# Patient Record
Sex: Male | Born: 1985 | Race: Black or African American | Hispanic: No | Marital: Single | Smoking: Never smoker
Health system: Southern US, Community
[De-identification: ages and names within clinical notes are randomized; demographics above are authoritative.]

## PROBLEM LIST (undated history)

## (undated) ENCOUNTER — Ambulatory Visit (HOSPITAL_COMMUNITY): Payer: BC Managed Care – PPO

## (undated) DIAGNOSIS — R51 Headache: Secondary | ICD-10-CM

## (undated) DIAGNOSIS — R519 Headache, unspecified: Secondary | ICD-10-CM

## (undated) HISTORY — PX: PARTIAL COLECTOMY: SHX5273

## (undated) HISTORY — PX: ANKLE SURGERY: SHX546

---

## 2012-06-24 DIAGNOSIS — K661 Hemoperitoneum: Secondary | ICD-10-CM | POA: Insufficient documentation

## 2012-06-24 DIAGNOSIS — S92001A Unspecified fracture of right calcaneus, initial encounter for closed fracture: Secondary | ICD-10-CM | POA: Insufficient documentation

## 2012-06-24 DIAGNOSIS — D62 Acute posthemorrhagic anemia: Secondary | ICD-10-CM | POA: Insufficient documentation

## 2012-06-25 DIAGNOSIS — E872 Acidosis: Secondary | ICD-10-CM | POA: Insufficient documentation

## 2012-06-25 DIAGNOSIS — D696 Thrombocytopenia, unspecified: Secondary | ICD-10-CM | POA: Insufficient documentation

## 2012-06-27 DIAGNOSIS — D709 Neutropenia, unspecified: Secondary | ICD-10-CM | POA: Insufficient documentation

## 2012-07-12 DIAGNOSIS — R569 Unspecified convulsions: Secondary | ICD-10-CM | POA: Insufficient documentation

## 2012-07-16 DIAGNOSIS — R112 Nausea with vomiting, unspecified: Secondary | ICD-10-CM | POA: Insufficient documentation

## 2014-11-03 ENCOUNTER — Emergency Department (HOSPITAL_COMMUNITY)
Admission: EM | Admit: 2014-11-03 | Discharge: 2014-11-05 | Disposition: A | Discharge status: Other Acute Inpatient Hospital | Payer: Self-pay | Attending: Emergency Medicine | Admitting: Emergency Medicine

## 2014-11-03 ENCOUNTER — Encounter (HOSPITAL_COMMUNITY): Payer: Self-pay | Admitting: Emergency Medicine

## 2014-11-03 DIAGNOSIS — Y9389 Activity, other specified: Secondary | ICD-10-CM | POA: Insufficient documentation

## 2014-11-03 DIAGNOSIS — T39312A Poisoning by propionic acid derivatives, intentional self-harm, initial encounter: Secondary | ICD-10-CM | POA: Insufficient documentation

## 2014-11-03 DIAGNOSIS — Y998 Other external cause status: Secondary | ICD-10-CM | POA: Insufficient documentation

## 2014-11-03 DIAGNOSIS — Y9289 Other specified places as the place of occurrence of the external cause: Secondary | ICD-10-CM | POA: Insufficient documentation

## 2014-11-03 DIAGNOSIS — F329 Major depressive disorder, single episode, unspecified: Secondary | ICD-10-CM | POA: Insufficient documentation

## 2014-11-03 DIAGNOSIS — X58XXXA Exposure to other specified factors, initial encounter: Secondary | ICD-10-CM | POA: Insufficient documentation

## 2014-11-03 DIAGNOSIS — T1491XA Suicide attempt, initial encounter: Secondary | ICD-10-CM

## 2014-11-03 LAB — BASIC METABOLIC PANEL
ANION GAP: 7 (ref 5–15)
BUN: 9 mg/dL (ref 6–23)
CO2: 25 mmol/L (ref 19–32)
CREATININE: 1 mg/dL (ref 0.50–1.35)
Calcium: 9.5 mg/dL (ref 8.4–10.5)
Chloride: 108 mEq/L (ref 96–112)
GFR calc Af Amer: >90 mL/min (ref >90)
GFR calc non Af Amer: >90 mL/min (ref >90)
Glucose, Bld: 98 mg/dL (ref 70–99)
POTASSIUM: 3.6 mmol/L (ref 3.5–5.1)
Sodium: 140 mmol/L (ref 135–145)

## 2014-11-03 LAB — CBC WITH DIFFERENTIAL/PLATELET
Basophils Absolute: 0 10*3/uL (ref 0.0–0.1)
Basophils Relative: 1 % (ref 0–1)
Eosinophils Absolute: 0 10*3/uL (ref 0.0–0.7)
Eosinophils Relative: 1 % (ref 0–5)
HCT: 41.6 % (ref 39.0–52.0)
Hemoglobin: 14.2 g/dL (ref 13.0–17.0)
Lymphocytes Relative: 37 % (ref 12–46)
Lymphs Abs: 1.6 10*3/uL (ref 0.7–4.0)
MCH: 31.3 pg (ref 26.0–34.0)
MCHC: 34.1 g/dL (ref 30.0–36.0)
MCV: 91.6 fL (ref 78.0–100.0)
Monocytes Absolute: 0.3 10*3/uL (ref 0.1–1.0)
Monocytes Relative: 8 % (ref 3–12)
NEUTROS PCT: 53 % (ref 43–77)
Neutro Abs: 2.3 10*3/uL (ref 1.7–7.7)
PLATELETS: 237 10*3/uL (ref 150–400)
RBC: 4.54 MIL/uL (ref 4.22–5.81)
RDW: 13.7 % (ref 11.5–15.5)
WBC: 4.3 10*3/uL (ref 4.0–10.5)

## 2014-11-03 LAB — I-STAT VENOUS BLOOD GAS, ED
ACID-BASE DEFICIT: 6 mmol/L — AB (ref 0.0–2.0)
BICARBONATE: 21.2 meq/L (ref 20.0–24.0)
O2 SAT: 40 %
PO2 VEN: 26 mmHg — AB (ref 30.0–45.0)
TCO2: 23 mmol/L (ref 0–100)
pCO2, Ven: 46.8 mmHg (ref 45.0–50.0)
pH, Ven: 7.26 (ref 7.250–7.300)

## 2014-11-03 LAB — RAPID URINE DRUG SCREEN, HOSP PERFORMED
AMPHETAMINES: NOT DETECTED
BENZODIAZEPINES: NOT DETECTED
Barbiturates: NOT DETECTED
Cocaine: NOT DETECTED
Opiates: NOT DETECTED
Tetrahydrocannabinol: NOT DETECTED

## 2014-11-03 LAB — LACTIC ACID, PLASMA
Lactic Acid, Venous: 7.6 mmol/L — ABNORMAL HIGH (ref 0.5–2.2)
Lactic Acid, Venous: 1.7 mmol/L (ref 0.5–2.2)

## 2014-11-03 LAB — COMPREHENSIVE METABOLIC PANEL
ALT: 18 U/L (ref 0–53)
ANION GAP: 17 — AB (ref 5–15)
AST: 34 U/L (ref 0–37)
Albumin: 5.1 g/dL (ref 3.5–5.2)
Alkaline Phosphatase: 50 U/L (ref 39–117)
BUN: 13 mg/dL (ref 6–23)
CALCIUM: 9.5 mg/dL (ref 8.4–10.5)
CO2: 17 mmol/L — AB (ref 19–32)
CREATININE: 0.98 mg/dL (ref 0.50–1.35)
Chloride: 106 mEq/L (ref 96–112)
GLUCOSE: 99 mg/dL (ref 70–99)
Potassium: 3.9 mmol/L (ref 3.5–5.1)
SODIUM: 140 mmol/L (ref 135–145)
Total Bilirubin: 2.8 mg/dL — ABNORMAL HIGH (ref 0.3–1.2)
Total Protein: 8 g/dL (ref 6.0–8.3)

## 2014-11-03 LAB — I-STAT CHEM 8, ED
BUN: 15 mg/dL (ref 6–23)
CREATININE: 0.9 mg/dL (ref 0.50–1.35)
Calcium, Ion: 1.23 mmol/L (ref 1.12–1.23)
Chloride: 102 mEq/L (ref 96–112)
Glucose, Bld: 95 mg/dL (ref 70–99)
HCT: 47 % (ref 39.0–52.0)
Hemoglobin: 16 g/dL (ref 13.0–17.0)
Potassium: 3.8 mmol/L (ref 3.5–5.1)
Sodium: 141 mmol/L (ref 135–145)
TCO2: 18 mmol/L (ref 0–100)

## 2014-11-03 LAB — ACETAMINOPHEN LEVEL: Acetaminophen (Tylenol), Serum: <10 ug/mL — ABNORMAL LOW (ref 10–30)

## 2014-11-03 LAB — PROTIME-INR
INR: 1.14 (ref 0.00–1.49)
PROTHROMBIN TIME: 14.8 s (ref 11.6–15.2)

## 2014-11-03 LAB — ETHANOL: Alcohol, Ethyl (B): <5 mg/dL (ref 0–9)

## 2014-11-03 LAB — CBG MONITORING, ED: Glucose-Capillary: 84 mg/dL (ref 70–99)

## 2014-11-03 LAB — CK: Total CK: 235 U/L — ABNORMAL HIGH (ref 7–232)

## 2014-11-03 LAB — SALICYLATE LEVEL

## 2014-11-03 MED ORDER — LACTATED RINGERS IV BOLUS (SEPSIS)
1000.0000 mL | Freq: Once | INTRAVENOUS | Status: AC
  Administered 2014-11-03: 1000 mL via INTRAVENOUS

## 2014-11-03 MED ORDER — LORAZEPAM 2 MG/ML IJ SOLN
1.0000 mg | Freq: Once | INTRAMUSCULAR | Status: AC
  Administered 2014-11-03: 1 mg via INTRAVENOUS

## 2014-11-03 MED ORDER — SODIUM CHLORIDE 0.9 % IV BOLUS (SEPSIS)
1000.0000 mL | Freq: Once | INTRAVENOUS | Status: AC
  Administered 2014-11-03: 1000 mL via INTRAVENOUS

## 2014-11-03 NOTE — ED Notes (Signed)
Staffing made aware need for sitter.  

## 2014-11-03 NOTE — ED Notes (Signed)
MD at bedside. 

## 2014-11-03 NOTE — Progress Notes (Signed)
   11/03/14 1700  Clinical Encounter Type  Visited With Patient and family together  Visit Type Initial;ED  Stress Factors  Family Stress Factors Exhausted;Family relationships   Chaplain was paged to visit with patient's sister. Patient's sister was much calmer by the time chaplain arrived. Patient's sister said she was ok now. Patient's sister explained that she came from work to be here and doesn't understand why her brother did this. Page Merrilyn Puma Chaplain if further support needed for patient or patient's family. Cranston Neighbor, Chaplain  5:28 PM

## 2014-11-03 NOTE — Code Documentation (Signed)
RRT RN called by operator at 1539 with report of visitor "fell out" in entrance to [facility] - asked operator if patient alert or breathing - she was not sure - she called the reception desk and they were unsure - Code Blue was activated.  On arrival to lobby patient was face down on floor - bystanders state he walked in and fell face forward without support - an RN was present - she states he has had a good pulse and resps - she arrived seconds after fall - with oral mucosa pink.  He is able to respond - tells [place] his name - reaches for something in his pocket - but disoriented and confused - spine protection support awaiting c-collar from ED staff.  Patient becomes more alert - mae x 4 - attempts to get up - then has less than 10 seconds of clonic movement - responds to voice during episode. Spinal precautions maintained - turned to back - resps reg and unlabored - good pulse - good color - placed c-collar.  Lifted to stretcher - transported to ED with ED staff and   His phone rings - the MD resident speaks with his sister who states he took 1 Advil or aleve.  Handoff to ED staff Hessie Diener and Dr. Rennis Chris.  No loss of pulse or respirations.

## 2014-11-03 NOTE — ED Notes (Signed)
Patient presented at main entrance and promptly collapsed in lobby. Code Blue and RRT called 1539. Spontaneous respirations with pulse present. Taken to ED by stretcher. Seizure activity noted in lobby. Pt combative requiring staff restraint when arrived at ED. Pt relaxed back to bed within 5 minutes. Bilateral rhythmic movements of arms noted again <1 min. Seizure protocol activated.

## 2014-11-03 NOTE — ED Notes (Signed)
Spoke with poison control, Cassandria Santee, states if pt alerts and oriented with no seizure activity this far, he is clear.

## 2014-11-03 NOTE — ED Provider Notes (Signed)
CSN: [id number]     Arrival date & time 11/03/14  1554 History   First MD Initiated Contact with Patient 11/03/14 [phone]     Chief Complaint  Patient presents with  . Loss of Consciousness     (Consider location/radiation/quality/duration/timing/severity/associated sxs/prior Treatment) Patient is a 29 y.o. male presenting with altered mental status.  Altered Mental Status Presenting symptoms: combativeness, confusion and unresponsiveness   Presenting symptoms comment:  Pt called wife today, sounded slurred, said "i love you" and reported he was driving but didn't tell her where.  Sister reports he took 35 aleve.  He walked into front of hospital and syncopized, with question of seizure activity Severity:  Moderate Most recent episode:  Today Episode history:  Single Duration:  10 seconds (appeared out for 10 sec) Timing:  Constant Progression:  Improving Chronicity:  New Context: alcohol use (social per wife)   Context: not drug use (wife denies known drug use), taking medications as prescribed, not a recent change in medication, not a recent illness and not a recent infection   Associated symptoms: depression   Associated symptoms: no headaches, no nausea, no rash and no vomiting     History reviewed. No pertinent past medical history. No past surgical history on file. History reviewed. No pertinent family history. History  Substance Use Topics  . Smoking status: Not on file  . Smokeless tobacco: Not on file  . Alcohol Use: Not on file    Review of Systems  Unable to perform ROS: Mental status change  Gastrointestinal: Negative for nausea and vomiting.  Skin: Negative for rash.  Neurological: Negative for headaches.  Psychiatric/Behavioral: Positive for confusion.      Allergies  Review of patient's allergies indicates no known allergies.  Home Medications   Prior to Admission medications   Not on File   BP 112/58 mmHg  Pulse 73  Temp(Src) 97.5 F (36.4 C)  (Oral)  Resp 14  SpO2 100% Physical Exam  Constitutional: He appears well-developed and well-nourished. He is easily aroused. No distress. Cervical collar in place.  HENT:  Head: Normocephalic and atraumatic.  Eyes: Conjunctivae and EOM are normal. Pupils are equal, round, and reactive to light.  Neck: Normal range of motion.  Cardiovascular: Normal rate, regular rhythm, normal heart sounds and intact distal pulses.  Exam reveals no gallop and no friction rub.   No murmur heard. Pulmonary/Chest: Effort normal and breath sounds normal. No respiratory distress. He has no wheezes. He has no rales.  Abdominal: Soft. He exhibits no distension. There is no tenderness. There is no guarding.  Musculoskeletal: He exhibits no edema.  Neurological: He is alert and easily aroused. GCS eye subscore is 3. GCS verbal subscore is 4. GCS motor subscore is 6.  Skin: Skin is warm and dry. He is not diaphoretic.  Psychiatric: He is withdrawn. He exhibits a depressed mood. He expresses suicidal ideation. He expresses suicidal plans.  Nursing note and vitals reviewed.   ED Course  Procedures (including critical care time) Labs Review Labs Reviewed  ACETAMINOPHEN LEVEL - Abnormal; Notable for the following:    Acetaminophen (Tylenol), Serum <10.0 (*)    All other components within normal limits  COMPREHENSIVE METABOLIC PANEL - Abnormal; Notable for the following:    CO2 17 (*)    Total Bilirubin 2.8 (*)    Anion gap 17 (*)    All other components within normal limits  CK - Abnormal; Notable for the following:    Total CK 235 (*)  All other components within normal limits  LACTIC ACID, PLASMA - Abnormal; Notable for the following:    Lactic Acid, Venous 7.6 (*)    All other components within normal limits  ACETAMINOPHEN LEVEL - Abnormal; Notable for the following:    Acetaminophen (Tylenol), Serum <10.0 (*)    All other components within normal limits  I-STAT VENOUS BLOOD GAS, ED - Abnormal;  Notable for the following:    pO2, Ven 26.0 (*)    Acid-base deficit 6.0 (*)    All other components within normal limits  ETHANOL  SALICYLATE LEVEL  CBC WITH DIFFERENTIAL  URINE RAPID DRUG SCREEN (HOSP PERFORMED)  PROTIME-INR  LACTIC ACID, PLASMA  BASIC METABOLIC PANEL  I-STAT CHEM 8, ED  CBG MONITORING, ED    Imaging Review No results found.   EKG Interpretation   Date/Time:  Monday November 03 2014 16:19:28 EST Ventricular Rate:  81 PR Interval:  126 QRS Duration: 90 QT Interval:  351 QTC Calculation: 407 R Axis:   81 Text Interpretation:  Sinus rhythm Right atrial enlargement No old tracing  to compare Confirmed by Ethelda Chick  MD, SAM [phone]) on 11/03/2014 4:30:46 PM      MDM   Final diagnoses:  None   29 year old male with no significant medical history presents with concern for suicidality ideation and suicide attempts with reported ibuprofen and Tylenol.  Patient reports that he took a bottle of ibuprofen of unknown milligrams, and half a bottle Tylenol in a suicide attempt.  He spoke with his 32yr old daughter and decided to come to the hospital.  Patient walked into the main doors in the hospital and had a syncopal event for which a CODE BLUE was called.  He reportedly lost consciousness for 10 seconds and there are mixed reports regarding possible seizure-like activity. Patient denies taking any other drugs or medications today. His EKG was evaluated by me and showed a normal sinus rhythm, with normal intervals. He does not regularly take medications and does not have a known psychiatric history.    Initial labs were significant for a lactic acid of 7.6, CK of 235, possibly indicating true seizure-like activity. CMP showed bicarb of 17 and VBG showed pH of 7.26. His CBC, alcohol level, aspirin level, Tylenol level were all within normal limits and nondetectable.  His mental status improved values in the emergency department, and his lactic acid decreased to 1.7.  Repeat  tylenol level (4hr after arrival) was also undetectable. Cervical collar removed as it was placed for fall from standing and his mental status improved and he did not have neck pain.  A repeat BMP showed no sign of acidosis.  Poison control was contacted and reported that he was to be medically cleared if had decrease in lactic acid, improved BMP, improved mental status and pt has exhibited these qualities. Pt reports he overdosed on tylenol however levels continue to be negative.  Pt observed for 6 hr with stable vital signs and improved mental status from ingestion of ibuprofen and possible other substance and feel he is medically stable for psychiatric evaluation at this time.    Patient IVCd and placed on psychiatric hold for suicide attempt.    Rhae Lerner, MD 11/04/14 1914  Doug Sou, MD 11/05/14 [phone]

## 2014-11-03 NOTE — ED Notes (Addendum)
Pt waking up, states "I took a full bottle of ibuprofen and half a bottle of tylenol at 1400 today." MD made aware. Unknown dosage.

## 2014-11-03 NOTE — ED Notes (Signed)
TTS in process 

## 2014-11-03 NOTE — Progress Notes (Signed)
   11/03/14 1600  Clinical Encounter Type  Visited With Patient;Health care provider  Visit Type Code;ED  Stress Factors  Patient Stress Factors Health changes   Chaplain responded to a code blue at the main entrance of the hospital. Patient was in the front near the doors when he collapsed. Medical team worked with patient and transported patient to ED. A physician answered patient's cell phone so a family member is aware of incident. Page Merrilyn Puma Chaplain if further support needed. Townsend Cudworth, Tommi Emery, Chaplain  4:20 PM

## 2014-11-03 NOTE — ED Notes (Addendum)
Pt removed his own IV. Sitter is at bedside. Pt in paper scrubs and has been wanded. Belongings removed from room.

## 2014-11-03 NOTE — ED Provider Notes (Addendum)
Seen on arrival Level V caveat altered mental status. Patient reportedly had seizure activity on the fourth hospital . He reportedly walked into the hospital from the street had seizure while on the floor. Patient admits to taking an unknown amount of Tylenol and Aleve earlier today in an effort to hurt himself. On arrival here patient was combative. He required Ativan per nursing protocol. Presently patient moves all extremities, somewhat sleepy. Opens eyes to verbal stimulus. He reports that he doesn't know what time he took the medication.  4:35 p.m. patient admits to taking an entire bottle of ibuprofen and half bottle of Tylenol at 2 PM. Stating he's been depressed about "a lot of things. Presently he is alert Glasgow Coma Score 15 calm and cooperative Patient was committed involuntarily for psychiatric evaluation by me as he was threatening to leave the hospital and felt to be a danger to himself  9 PM patient is alert Glasgow Coma Score 15 states "I feel fine.  Appears comfortable. 10 pmPt felt to be cleared for psychiatric evaluation CRITICAL CARE Performed by: Doug Sou Total critical care time: 30 minutes Critical care time was exclusive of separately billable procedures and treating other patients. Critical care was necessary to treat or prevent imminent or life-threatening deterioration. Critical care was time spent personally by me on the following activities: development of treatment plan with patient and/or surrogate as well as nursing, discussions with consultants, evaluation of patient's response to treatment, examination of patient, obtaining history from patient or surrogate, ordering and performing treatments and interventions, ordering and review of laboratory studies, ordering and review of radiographic studies, pulse oximetry and re-evaluation of patient's condition.   Doug Sou, MD 11/03/14 2103  Doug Sou, MD 11/03/14 2159

## 2014-11-03 NOTE — ED Notes (Signed)
Pt is sleeping, sitter at bedside. No respiratory distress.
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
## 2014-11-03 NOTE — BH Assessment (Signed)
Writer informed TTS Corrie Dandy of the TTS consult.
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
## 2014-11-04 NOTE — ED Notes (Signed)
Pt up to use restroom. Sitter outside door. When pt came out of restroom IV was removed, pt states "it was hurting too bad," catheter intact, site clean and dry.

## 2014-11-04 NOTE — ED Notes (Signed)
Boyd Kerbsenny notified that the [facility] has not responded yet and that pt might be transported in the morning.

## 2014-11-04 NOTE — ED Notes (Signed)
SPOKE W/MAGISTRATE RE: IVC PAPERS THAT WERE FAXED LAST PM D/T NO FINDINGS AND CUSTODY ON CHART. MAGISTRATE ADVISED NEED TO RE-FAX PAPERWORK D/T DOES NOT HAVE THEM - RE-FAXED.

## 2014-11-04 NOTE — ED Provider Notes (Signed)
[facility] BP 109/66 mmHg  Pulse 65  Temp(Src) 98 F (36.7 C) (Oral)  Resp 20  SpO2 100% Patient in no distress  Gerhard Munchobert Gwen Edler, MD 11/04/14 2134

## 2014-11-04 NOTE — BH Assessment (Signed)
IVC papers were received by [facility]ne [facility]Ann at [facility].  Per Dewayne HatchAnn patient is declined due to needing a higher level of care.

## 2014-11-04 NOTE — BH Assessment (Signed)
[facility] has been [facility].

## 2014-11-04 NOTE — BH Assessment (Signed)
Windy KalataYasemia, RN will fax the IVC paperwork and nursing notes to [facility] [phone]( [phone]).

## 2014-11-04 NOTE — BHH Counselor (Signed)
Per [facility] F[facility]Paul at [facility]:  Referral declined due to Agression   Beryle FlockMary Livia Tarr, MS, CRC, [facility] Triage Specialist [facility]

## 2014-11-04 NOTE — ED Notes (Signed)
IVC papers and Nursing notes [facility] to [facility] as [facility] [phone]. So far no success, fax line sound busy, fax not going through. We'll continue trying.

## 2014-11-04 NOTE — ED Notes (Signed)
Pt use phone call x1, no distress noted during phone call.

## 2014-11-05 ENCOUNTER — Encounter (HOSPITAL_COMMUNITY): Payer: Self-pay

## 2014-11-05 ENCOUNTER — Inpatient Hospital Stay (HOSPITAL_COMMUNITY)
Admission: AD | Admit: 2014-11-05 | Discharge: 2014-11-06 | DRG: 881 | Disposition: A | Discharge status: Home / Self Care | Payer: Federal, State, Local not specified - Other | Source: Intra-hospital | Attending: Psychiatry | Admitting: Psychiatry

## 2014-11-05 DIAGNOSIS — R45851 Suicidal ideations: Secondary | ICD-10-CM | POA: Diagnosis present

## 2014-11-05 DIAGNOSIS — T50901A Poisoning by unspecified drugs, medicaments and biological substances, accidental (unintentional), initial encounter: Secondary | ICD-10-CM | POA: Insufficient documentation

## 2014-11-05 DIAGNOSIS — F32A Depression, unspecified: Secondary | ICD-10-CM | POA: Insufficient documentation

## 2014-11-05 DIAGNOSIS — F329 Major depressive disorder, single episode, unspecified: Secondary | ICD-10-CM | POA: Diagnosis present

## 2014-11-05 DIAGNOSIS — G4700 Insomnia, unspecified: Secondary | ICD-10-CM | POA: Diagnosis present

## 2014-11-05 DIAGNOSIS — Z599 Problem related to housing and economic circumstances, unspecified: Secondary | ICD-10-CM

## 2014-11-05 MED ORDER — ALUM & MAG HYDROXIDE-SIMETH 200-200-20 MG/5ML PO SUSP: 30.0000 mL | ORAL | Status: DC | PRN

## 2014-11-05 MED ORDER — HYDROXYZINE HCL 25 MG PO TABS
25.0000 mg | ORAL_TABLET | Freq: Four times a day (QID) | ORAL | Status: DC | PRN
Start: 2014-11-05 — End: 2014-11-06

## 2014-11-05 MED ORDER — MAGNESIUM HYDROXIDE 400 MG/5ML PO SUSP: 30.0000 mL | Freq: Every day | ORAL | Status: DC | PRN

## 2014-11-05 MED ORDER — ACETAMINOPHEN 325 MG PO TABS: 650.0000 mg | ORAL_TABLET | Freq: Four times a day (QID) | ORAL | Status: DC | PRN

## 2014-11-05 NOTE — Tx Team (Signed)
Initial Interdisciplinary Treatment Plan   PATIENT STRESSORS: Financial difficulties Marital or family conflict Occupational concerns   PATIENT STRENGTHS: Capable of independent living General fund of knowledge Supportive family/friends   PROBLEM LIST: Problem List/Patient Goals Date to be addressed Date deferred Reason deferred Estimated date of resolution  "work on talking to my girlfriend "      Suicidal gesture                                                 DISCHARGE CRITERIA:  Improved stabilization in mood, thinking, and/or behavior Reduction of life-threatening or endangering symptoms to within safe limits  PRELIMINARY DISCHARGE PLAN: Attend aftercare/continuing care group Return to previous living arrangement  PATIENT/FAMIILY INVOLVEMENT: This treatment plan has been presented to and reviewed with the patient, Christopher Sharp, and/or family member, .  The patient and family have been given the opportunity to ask questions and make suggestions.  Andrena Mewsuttall, Christopher Sharp 11/05/2014, 2:28 AM

## 2014-11-05 NOTE — [facility] Group Notes (Signed)
[facility]   11/05/2014 11:03 AM    Participation Quality:  Appropraite  Mood/Affect:  Appropriate  Depression Rating:  1  Anxiety Rating:  1  Thoughts of Suicide:  No  Will you contract for safety?   NA  Current AVH:  No  Plan for Discharge/Comments:  Patient attended discharge planning group and actively participated in group. He advised of acting impulsively and taking pills in a suicide attempt.  He will need a referral for outpatient servicaes.Suicide prevention education reviewed and SPE document provided.   Transportation Means: Patient has transportation.   Supports:  Patient has a support system.   Sumner Boesch, Joesph JulyQuylle Hairston
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
## 2014-11-05 NOTE — H&P (Signed)
Psychiatric Admission Assessment Adult  Patient Identification:  Chanan Detwiler  Date of Evaluation:  11/05/2014  Chief Complaint:  UNSPECIFIED DEPRESSIVE DISORDER   History of Present Illness: Kevonta is a 29 year old African-American male. Admitted to [facility] from the [facility] ED. He reports, "My problems started last June. I have been going through a lot, got married June of 2014, legally separated since March of 2015. My wife and I had a daughter together. I'm working too jobs just to make ends meet and to support my daughter. I'm unable to get ahead. I feel very stressed. Started losing weight because I was not eating regularly because I don't have an appetite. Then I started feeling like, may be I will be better of not being here. I got frustrated, took some Ibuprofen pills. Then realized what I had done after talking to my daughter. I drove myself to the hospital to get help. I feel better now. I was not thinking about suicide when I took the pills. It was a spur of the moment kind of thing at the time. I'm not even depressed. I have good support system, at least, I know that now. I need to go home".  Elements:  Location:  Depression, mild. Quality:  Suicidal ideations, suicide attempt by overdose, high anxiety levels. Severity:  Moderate. Timing:  Stressed for a year. Duration:  Chronic. Context:  Got married in June of of 2014, separated March of 2015, had a daughter, work two jobs, can't get ahead, thought, better of not being here, took some pills".  Associated Signs/Synptoms:  Depression Symptoms:  loss of energy/fatigue, weight loss, decreased appetite,  (Hypo) Manic Symptoms:  Impulsivity,  Anxiety Symptoms:  Excessive Worry,  Psychotic Symptoms:  Denies  PTSD Symptoms: NA  Total Time spent with patient: 1 hour  Psychiatric Specialty Exam: Physical Exam  Constitutional: He is oriented to person, place, and time. He appears well-developed.  HENT:  Head:  Normocephalic.  Eyes: Pupils are equal, round, and reactive to light.  Neck: Normal range of motion.  Cardiovascular: Normal rate.   Respiratory: Effort normal.  GI: Soft.  Genitourinary:  Denies any issues in this areas  Musculoskeletal: Normal range of motion.  Neurological: He is alert and oriented to person, place, and time.  Skin: Skin is warm and dry.  Psychiatric: His speech is normal and behavior is normal. Thought content normal. His mood appears anxious. His affect is not angry, not blunt, not labile and not inappropriate. Cognition and memory are normal. He expresses impulsivity. He exhibits a depressed mood.    Review of Systems  Constitutional: Positive for weight loss and malaise/fatigue.  Eyes: Negative.   Respiratory: Negative.   Cardiovascular: Negative.   Gastrointestinal: Negative.   Genitourinary: Negative.   Musculoskeletal: Negative.   Skin: Negative.   Neurological: Positive for weakness.  Endo/Heme/Allergies: Negative.   Psychiatric/Behavioral: Positive for depression and suicidal ideas. Negative for hallucinations, memory loss and substance abuse. The patient is nervous/anxious and has insomnia.     Blood pressure 106/72, pulse 69, temperature 97.5 F (36.4 C), temperature source Oral, resp. rate 20, height '6\' 1"'  (1.854 m), weight 58.968 kg (130 lb).Body mass index is 17.16 kg/(m^2).  General Appearance: Casual and thin  Eye Contact::  Good  Speech:  Clear and Coherent  Volume:  Normal  Mood:  "Not depressed, but anxious because I wants to go home"  Affect:  Appropriate  Thought Process:  Coherent, Intact and Logical  Orientation:  Full (Time,  Place, and Person)  Thought Content:  Denies any hallucinations, delusions and or paranoia  Suicidal Thoughts:  No  Homicidal Thoughts:  No  Memory:  Immediate;   Good Recent;   Good Remote;   Good  Judgement:  Fair  Insight:  Present  Psychomotor Activity:  Normal  Concentration:  Good  Recall:  Good   Fund of Knowledge:Fair  Language: Good  Akathisia:  No  Handed:  Right  AIMS (if indicated):     Assets:  Desire for Improvement  Sleep:  Number of Hours: 3   Musculoskeletal: Strength & Muscle Tone: within normal limits Gait & Station: normal Patient leans: N/A  Past Psychiatric History: Diagnosis: "Stressed, not depressed"  Hospitalizations: None reported  Outpatient Care: None reported  Substance Abuse Care: Denies  Self-Mutilation: Denies  Suicidal Attempts: "Yes, by overdose"  Violent Behaviors: Denies   Past Medical History:  History reviewed. No pertinent past medical history. None.  Allergies:  No Known Allergies  PTA Medications: Prescriptions prior to admission  Medication Sig Dispense Refill Last Dose  . Multiple Vitamin (MULTIVITAMIN WITH MINERALS) TABS tablet Take 1 tablet by mouth daily.   11/03/2014   Previous Psychotropic Medications: Medication/Dose  See medication lists               Substance Abuse History in the last 12 months:  No.  Consequences of Substance Abuse: Medical Consequences:  Liver damage, Possible death by overdose Legal Consequences:  Arrests, jail time, Loss of driving privilege. Family Consequences:  Family discord, divorce and or separation.  Social History:  has no tobacco, alcohol, and drug history on file. Additional Social History: Pain Medications: none Prescriptions: none Over the Counter: none History of alcohol / drug use?: No history of alcohol / drug abuse Current Place of Residence: [place], [place] of Birth: [place], [place]   Family Members: "My girlfriend, my daughter"  Marital Status:  Married  Children: 1  Sons: 0  Daughters:1  Relationships: Married  Education:  GED  Educational Problems/Performance:: Obtained GED  Religious Beliefs/Practices: NA  History of Abuse (Emotional/Phsycial/Sexual): Denies  Occupational Experiences: Employed  Nature conservation officer History:   None.  Legal History: Denies any pending legal charges  Hobbies/Interests: NA  Family History:  History reviewed. No pertinent family history.  Results for orders placed or performed during the hospital encounter of 11/03/14 (from the past 72 hour(s))  Acetaminophen level     Status: Abnormal   Collection Time: 11/03/14  4:00 PM  Result Value Ref Range   Acetaminophen (Tylenol), Serum <10.0 (L) 10 - 30 ug/mL    Comment:        THERAPEUTIC CONCENTRATIONS VARY SIGNIFICANTLY. A RANGE OF 10-30 ug/mL MAY BE AN EFFECTIVE CONCENTRATION FOR MANY PATIENTS. HOWEVER, SOME ARE BEST TREATED AT CONCENTRATIONS OUTSIDE THIS RANGE. ACETAMINOPHEN CONCENTRATIONS >150 ug/mL AT 4 HOURS AFTER INGESTION AND >50 ug/mL AT 12 HOURS AFTER INGESTION ARE OFTEN ASSOCIATED WITH TOXIC REACTIONS.   Comprehensive metabolic panel     Status: Abnormal   Collection Time: 11/03/14  4:00 PM  Result Value Ref Range   Sodium 140 135 - 145 mmol/L    Comment: Please note change in reference range.   Potassium 3.9 3.5 - 5.1 mmol/L    Comment: Please note change in reference range.   Chloride 106 96 - 112 mEq/L   CO2 17 (L) 19 - 32 mmol/L   Glucose, Bld 99 70 - 99 mg/dL   BUN 13 6 - 23  mg/dL   Creatinine, Ser 0.98 0.50 - 1.35 mg/dL   Calcium 9.5 8.4 - 10.5 mg/dL   Total Protein 8.0 6.0 - 8.3 g/dL   Albumin 5.1 3.5 - 5.2 g/dL   AST 34 0 - 37 U/L   ALT 18 0 - 53 U/L   Alkaline Phosphatase 50 39 - 117 U/L   Total Bilirubin 2.8 (H) 0.3 - 1.2 mg/dL   GFR calc non Af Amer >90 >90 mL/min   GFR calc Af Amer >90 >90 mL/min    Comment: (NOTE) The eGFR has been calculated using the CKD EPI equation. This calculation has not been validated in all clinical situations. eGFR's persistently <90 mL/min signify possible Chronic Kidney Disease.    Anion gap 17 (H) 5 - 15  Ethanol     Status: None   Collection Time: 11/03/14  4:00 PM  Result Value Ref Range   Alcohol, Ethyl (B) <5 0 - 9 mg/dL    Comment:        LOWEST  DETECTABLE LIMIT FOR SERUM ALCOHOL IS 11 mg/dL FOR MEDICAL PURPOSES ONLY   Salicylate level     Status: None   Collection Time: 11/03/14  4:00 PM  Result Value Ref Range   Salicylate Lvl <3.0 2.8 - 20.0 mg/dL  CBC with Differential     Status: None   Collection Time: 11/03/14  4:00 PM  Result Value Ref Range   WBC 4.3 4.0 - 10.5 K/uL   RBC 4.54 4.22 - 5.81 MIL/uL   Hemoglobin 14.2 13.0 - 17.0 g/dL   HCT 41.6 39.0 - 52.0 %   MCV 91.6 78.0 - 100.0 fL   MCH 31.3 26.0 - 34.0 pg   MCHC 34.1 30.0 - 36.0 g/dL   RDW 13.7 11.5 - 15.5 %   Platelets 237 150 - 400 K/uL   Neutrophils Relative % 53 43 - 77 %   Neutro Abs 2.3 1.7 - 7.7 K/uL   Lymphocytes Relative 37 12 - 46 %   Lymphs Abs 1.6 0.7 - 4.0 K/uL   Monocytes Relative 8 3 - 12 %   Monocytes Absolute 0.3 0.1 - 1.0 K/uL   Eosinophils Relative 1 0 - 5 %   Eosinophils Absolute 0.0 0.0 - 0.7 K/uL   Basophils Relative 1 0 - 1 %   Basophils Absolute 0.0 0.0 - 0.1 K/uL  CK     Status: Abnormal   Collection Time: 11/03/14  4:00 PM  Result Value Ref Range   Total CK 235 (H) 7 - 232 U/L  CBG monitoring, ED     Status: None   Collection Time: 11/03/14  4:20 PM  Result Value Ref Range   Glucose-Capillary 84 70 - 99 mg/dL  Protime-INR     Status: None   Collection Time: 11/03/14  4:30 PM  Result Value Ref Range   Prothrombin Time 14.8 11.6 - 15.2 seconds   INR 1.14 0.00 - 1.49  Lactic acid, plasma     Status: Abnormal   Collection Time: 11/03/14  4:30 PM  Result Value Ref Range   Lactic Acid, Venous 7.6 (H) 0.5 - 2.2 mmol/L  I-Stat Chem 8, ED     Status: None   Collection Time: 11/03/14  4:43 PM  Result Value Ref Range   Sodium 141 135 - 145 mmol/L   Potassium 3.8 3.5 - 5.1 mmol/L   Chloride 102 96 - 112 mEq/L   BUN 15 6 - 23 mg/dL   Creatinine, Ser  0.90 0.50 - 1.35 mg/dL   Glucose, Bld 95 70 - 99 mg/dL   Calcium, Ion 1.23 1.12 - 1.23 mmol/L   TCO2 18 0 - 100 mmol/L   Hemoglobin 16.0 13.0 - 17.0 g/dL   HCT 47.0 39.0 - 52.0 %   I-Stat Venous Blood Gas, ED (order at [facility] and [facility] only)     Status: Abnormal   Collection Time: 11/03/14  4:44 PM  Result Value Ref Range   pH, Ven 7.260 7.250 - 7.300   pCO2, Ven 46.8 45.0 - 50.0 mmHg   pO2, Ven 26.0 (LL) 30.0 - 45.0 mmHg   Bicarbonate 21.2 20.0 - 24.0 mEq/L   TCO2 23 0 - 100 mmol/L   O2 Saturation 40.0 %   Acid-base deficit 6.0 (H) 0.0 - 2.0 mmol/L   Patient temperature 97.5 F    Sample type VENOUS    Comment NOTIFIED PHYSICIAN   Acetaminophen level     Status: Abnormal   Collection Time: 11/03/14  8:00 PM  Result Value Ref Range   Acetaminophen (Tylenol), Serum <10.0 (L) 10 - 30 ug/mL    Comment:        THERAPEUTIC CONCENTRATIONS VARY SIGNIFICANTLY. A RANGE OF 10-30 ug/mL MAY BE AN EFFECTIVE CONCENTRATION FOR MANY PATIENTS. HOWEVER, SOME ARE BEST TREATED AT CONCENTRATIONS OUTSIDE THIS RANGE. ACETAMINOPHEN CONCENTRATIONS >150 ug/mL AT 4 HOURS AFTER INGESTION AND >50 ug/mL AT 12 HOURS AFTER INGESTION ARE OFTEN ASSOCIATED WITH TOXIC REACTIONS.   Drug screen panel, emergency     Status: None   Collection Time: 11/03/14  8:06 PM  Result Value Ref Range   Opiates NONE DETECTED NONE DETECTED   Cocaine NONE DETECTED NONE DETECTED   Benzodiazepines NONE DETECTED NONE DETECTED   Amphetamines NONE DETECTED NONE DETECTED   Tetrahydrocannabinol NONE DETECTED NONE DETECTED   Barbiturates NONE DETECTED NONE DETECTED    Comment:        DRUG SCREEN FOR MEDICAL PURPOSES ONLY.  IF CONFIRMATION IS NEEDED FOR ANY PURPOSE, NOTIFY LAB WITHIN 5 DAYS.        LOWEST DETECTABLE LIMITS FOR URINE DRUG SCREEN Drug Class       Cutoff (ng/mL) Amphetamine      1000 Barbiturate      200 Benzodiazepine   009 Tricyclics       381 Opiates          300 Cocaine          300 THC              50   Lactic acid, plasma     Status: None   Collection Time: 11/03/14  8:08 PM  Result Value Ref Range   Lactic Acid, Venous 1.7 0.5 - 2.2 mmol/L  Basic metabolic panel     Status: None    Collection Time: 11/03/14  9:20 PM  Result Value Ref Range   Sodium 140 135 - 145 mmol/L    Comment: Please note change in reference range.   Potassium 3.6 3.5 - 5.1 mmol/L    Comment: Please note change in reference range.   Chloride 108 96 - 112 mEq/L   CO2 25 19 - 32 mmol/L   Glucose, Bld 98 70 - 99 mg/dL   BUN 9 6 - 23 mg/dL   Creatinine, Ser 1.00 0.50 - 1.35 mg/dL   Calcium 9.5 8.4 - 10.5 mg/dL   GFR calc non Af Amer >90 >90 mL/min   GFR calc Af Amer >90 >90 mL/min  Comment: (NOTE) The eGFR has been calculated using the CKD EPI equation. This calculation has not been validated in all clinical situations. eGFR's persistently <90 mL/min signify possible Chronic Kidney Disease.    Anion gap 7 5 - 15   Psychological Evaluations:  Assessment:   DSM5: Schizophrenia Disorders:  NA Obsessive-Compulsive Disorders:  NA Trauma-Stressor Disorders:  NA Substance/Addictive Disorders:  NA Depressive Disorders:  MDD (major depressive disorder)  AXIS I:  MDD (major depressive disorder)  AXIS II:  Deferred  AXIS III:  History reviewed. No pertinent past medical history.  AXIS IV:  other psychosocial or environmental problems  AXIS V:  11-20 some danger of hurting self or others possible OR occasionally fails to maintain minimal personal hygiene OR gross impairment in communication  Treatment Plan/Recommendations: 1. Admit for crisis management and stabilization, estimated length of stay 3-5 days.  2. Medication management to reduce current symptoms to base line and improve the patient's overall level of functioning; declines to be on any medication regimen.  3. Treat health problems as indicated.  4. Develop treatment plan to decrease risk of relapse upon discharge and the need for readmission.  5. Psycho-social education regarding relapse prevention and self care.  6. Health care follow up as needed for medical problems.  7. Review, reconcile, and reinstate any pertinent home  medications for other health issues where appropriate. 8. Call for consults with hospitalist for any additional specialty patient care services as needed.  Treatment Plan Summary: Daily contact with patient to assess and evaluate symptoms and progress in treatment Medication management  Current Medications:  Current Facility-Administered Medications  Medication Dose Route Frequency Provider Last Rate Last Dose  . acetaminophen (TYLENOL) tablet 650 mg  650 mg Oral Q6H PRN Benjamine Mola, FNP      . alum & mag hydroxide-simeth (MAALOX/MYLANTA) 200-200-20 MG/5ML suspension 30 mL  30 mL Oral Q4H PRN Benjamine Mola, FNP      . hydrOXYzine (ATARAX/VISTARIL) tablet 25 mg  25 mg Oral Q6H PRN Benjamine Mola, FNP      . magnesium hydroxide (MILK OF MAGNESIA) suspension 30 mL  30 mL Oral Daily PRN Benjamine Mola, FNP        Observation Level/Precautions:  15 minute checks  Laboratory:  Per ED  Psychotherapy:  Group sessions  Medications:  See medication lists  Consultations:  As needed  Discharge Concerns: Safety, mood stabilization   Estimated LOS: 2-4 days  Other:     I certify that inpatient services furnished can reasonably be expected to improve the patient's condition.   Lindell Spar I, PMHNP-BC 1/6/201611:18 AM  I have discussed case with NP as above and have met with patient. Agree with NP's note, assessment, plan. Patient is a 29 year old man, who minimizes any prior psychiatric history. He reports significant stress, related to working two jobs, trying to provide the best he can for his child ( who lives with the mother- they are separated) . Patient reports feeling overwhelmed recently and impulsively overdosed on about 8 Advils, as per his report, after which he came to hospital. Denies any prior history of self injurious behaviors or thoughts, denies any prior depression, denies any recent significant neuro-vegetative symptoms, and at this time denies feeling sad or depressed, and is  hoping for discharge soon so he can return to work. He presents as future oriented and discussed plans to  Cut down to one job ( from current two) and return to college in the  near future, once he has finished paying off his car. We discussed possible medication - antidepressant or antianxiety - treatment but he is not interested at this time. Will follow

## 2014-11-05 NOTE — Progress Notes (Signed)
D: Patient denies SI/HI and A/V hallucinations; patient reports sleep is good; reports appetite is good; reports energy level is normal ; reports ability to concentrate is good; rates depression as 0/10; rates hopelessness 0/10; rates anxiety as 0/10;   A: Monitored q 15 minutes; patient encouraged to attend groups; patient educated about medications; patient given medications per physician orders; patient encouraged to express feelings and/or concerns  R: Patient is quiet and minimal; patient is cooperative and wants to go home; patient states " Im working two jobs to keep my apartment, I cant see my daughter, I got married and divorced in the same year and I just have a lot going on and I need to get home and Im stuck in here";  patient was able to set goal to talk with staff 1:1 when having feelings of SI;

## 2014-11-05 NOTE — [facility] Suicide Risk Assessment (Signed)
   Nursing information obtained from:    Demographic factors:    29 year old male, separated, employed  Current Mental Status:   See below Loss Factors:   Separation, working two jobs, stopped college  Historical Factors:   Denies any prior history of psychiatric symptoms or illness  Risk Reduction Factors:   Sense of responsibility to child and family Total Time spent with patient: 45 minutes  CLINICAL FACTORS:  Recent impulsive overdose in the context of stressors   Psychiatric Specialty Exam: Physical Exam  ROS  Blood pressure 106/72, pulse 69, temperature 97.5 F (36.4 C), temperature source Oral, resp. rate 20, height 6\' 1"  (1.854 m), weight 130 lb (58.968 kg).Body mass index is 17.16 kg/(m^2).  General Appearance: Well Groomed  Patent attorneyye Contact::  Good  Speech:  Normal Rate  Volume:  Normal  Mood:  today denies depression and appears euthymic  Affect:  Appropriate and reactive   Thought Process:  Goal Directed and Linear  Orientation:  Full (Time, Place, and Person)  Thought Content:  denies hallucinations, no delusions  Suicidal Thoughts:  No at this time denies any thoughts of hurting self and  contracts for safety on unit   Homicidal Thoughts:  No  Memory:  Recent and Remote grossly intact  Judgement:  Fair  Insight:  Fair  Psychomotor Activity:  Normal  Concentration:  Good  Recall:  Good  Fund of Knowledge:Good  Language: Good  Akathisia:  No  Handed:  Right  AIMS (if indicated):     Assets:  Desire for Improvement Physical Health Resilience  Sleep:  Number of Hours: 3   Musculoskeletal: Strength & Muscle Tone: within normal limits Gait & Station: normal Patient leans: N/A  COGNITIVE FEATURES THAT CONTRIBUTE TO RISK:  Closed-mindedness    SUICIDE RISK:   Moderate:  Frequent suicidal ideation with limited intensity, and duration, some specificity in terms of plans, no associated intent, good self-control, limited dysphoria/symptomatology, some risk factors  present, and identifiable protective factors, including available and accessible social support.  PLAN OF CARE: Patient will be admitted to inpatient psychiatric unit for stabilization and safety. Will provide and encourage milieu participation. Provide medication management and maked adjustments as needed.  Will follow daily.    I certify that inpatient services furnished can reasonably be expected to improve the patient's condition.  Christopher Sharp 11/05/2014, 5:09 PM

## 2014-11-05 NOTE — Progress Notes (Signed)
D: Patient in bed on approach.  Patient states he had a good day.  Patient states he feels he is ready for discharge.  Patient states this is his first admission and states he was stressed out.  Patient states he understands the severity of what he did and states he will never do anything like that again.  Patient states he knows now that he has support from his family and his girlfriend.  Patient states he needs to open up more with the people who support him.  Patient denies SI/HI and denies AVH. A: Staff to monitor Q 15 mins for safety.  Encouragement and support offered.  No scheduled  medications administered per orders. R: Patient remains safe on the unit.  Patient did not attend group tonight.  Patient visible on the unit tonight.  Patient has not medications tonight
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
## 2014-11-05 NOTE — [facility] Group Notes (Signed)
Adult Psychoeducational Group Note  Date:  11/05/2014 Time:  9:56 PM  Group Topic/Focus:  NA Meeting  Participation Level:  Did Not Attend  Participation Quality:  None  Affect:  None  Cognitive:  None  Insight: None  Engagement in Group:  None  Modes of Intervention:  Discussion and Education  Additional Comments:  Clifton Custardaron did not attend group.  Caroll RancherLindsay, Rocklin Soderquist A 11/05/2014, 9:56 PM

## 2014-11-05 NOTE — ED Notes (Signed)
[facility] aware [employer] has been contacted for transport.

## 2014-11-05 NOTE — [facility] Group Notes (Signed)
[facility] LCSW Group Therapy  Emotional Regulation 1:15 - 2: 30 PM        11/05/2014  2:42 PM    Type of Therapy:  Group Therapy  Participation Level:  Patient meeting with CSW and MD - not able to attend group. Wynn BankerHodnett, Teigan Sahli Hairston 11/05/2014 2:42 PM

## 2014-11-06 DIAGNOSIS — F329 Major depressive disorder, single episode, unspecified: Secondary | ICD-10-CM | POA: Insufficient documentation

## 2014-11-06 DIAGNOSIS — T50901A Poisoning by unspecified drugs, medicaments and biological substances, accidental (unintentional), initial encounter: Secondary | ICD-10-CM | POA: Insufficient documentation

## 2014-11-06 DIAGNOSIS — F32A Depression, unspecified: Secondary | ICD-10-CM | POA: Insufficient documentation

## 2014-11-06 NOTE — Progress Notes (Signed)
Patient discharged per physician order; patient denies SI/HI and A/V hallucinations; patient received copy of AVS after it was reviewed; patient had no other questions or concerns at this time; patient verbalized and signed that he received all belongings; patient left the unit ambulatory with security officer to be taken to his car in the [facility] parking lot

## 2014-11-06 NOTE — [facility] Group Notes (Signed)
[facility] Group Notes:  (Nursing/MHT/Case Management/Adjunct)  Date:  11/06/2014  Time:  0915  Type of Therapy:  Psychoeducational Skills  Participation Level:  Did Not Attend  Participation Quality: n/a  Affect: n/a  Cognitive: n/a  Insight:  n/a  Engagement in Group: n/a  Modes of Intervention:  Discussion, Education, Exploration and Support  Summary of Progress/Problems: Pt did not attend group. Pt in bed asleep.   Aurora Maskwyman, Amoni Scallan E 11/06/2014, 11:23 AM

## 2014-11-06 NOTE — [facility] Suicide Risk Assessment (Signed)
[facility] INPATIENT:  Family/Significant Other Suicide Prevention Education  Suicide Prevention Education:  Contact Attempts: Sheral ApleyMonetta Perez (pt's gf) [phone] has been identified by the patient as the family member/significant other with whom the patient will be residing, and identified as the person(s) who will aid the patient in the event of a mental health crisis.  With written consent from the patient, two attempts were made to provide suicide prevention education, prior to and/or following the patient's discharge.  We were unsuccessful in providing suicide prevention education.  A suicide education pamphlet was given to the patient to share with family/significant other.  Date and time of first attempt: 11/05/14 (generic vm left requesting call back at earliest convenience) Date and time of second attempt: 11/06/14 (generic vm left requesting call back at earliest convenience)  Smart, Prairiewood VillageHeather LCSWA  11/06/2014, 10:32 AM

## 2014-11-06 NOTE — Tx Team (Signed)
Interdisciplinary Treatment Plan Update (Adult)   Date: 11/05/13 Time Reviewed: 9:30AM Progress in Treatment:  Attending groups: intermittently  Participating in groups:  Minimally  Taking medication as prescribed: Yes  Tolerating medication: Yes  Family/Significant othe contact made: Contact attempts made with gf.  Patient understands diagnosis: Yes, AEB seeking treatment for SI/overdose and mood stabilization.  Discussing patient identified problems/goals with staff: Yes  Medical problems stabilized or resolved: Yes  Denies suicidal/homicidal ideation: Yes during self report.  Patient has not harmed self or Others: Yes  New problem(s) identified:  Discharge Plan or Barriers: Pt plans to return home at d/c and [facility]. Pt does not want to take meds but is interested in therapy. CS[facility]. Pt will be transported by security [facility] after lunch today, where his car is in parking lot.  Additional comments: Christopher Sharp is a 29 year old African-American male. [facility]. He reports, "My problems started last June. I have been going through a lot, got married June of 2014, legally separated since March of 2015. My wife and I had a daughter together. I'm working too jobs just to make ends meet and to support my daughter. I'm unable to get ahead. I feel very stressed. Started losing weight because I was not eating regularly because I don't have an appetite. Then I started feeling like, may be I will be better of not being here. I got frustrated, took some Ibuprofen pills. Then realized what I had done after talking to my daughter. I drove myself to the hospital to get help. I feel better now. I was not thinking about suicide when I took the pills. It was a spur of the moment kind of thing at the time. I'm not even depressed. I have good support system, at least, I know that now. I need to go home". Reason  for Continuation of Hospitalization: Mood stabilization  Estimated length of stay: 1-2 days (likely d/c Thurs 11/06/14)  For review of initial/current patient goals, please see plan of care.  Attendees:  Patient:    Family:    Physician: Dr. Adela Glimpseabos MD 11/05/14 9:30AM  Nursing:    Clinical Social Worker The Sherwin-WilliamsHeather Smart, LCSWA  11/05/14 9:30AM  Other:    Other: Darden DatesJennifer C. Nurse CM 11/05/14 9:30AM  Other: Liliane Badeolora Sutton, Community Care Coordinator  11/05/14 9:30AM  Other:    Scribe for Treatment Team:  Trula SladeHeather Smart LCSWA 11/05/14 9:30AM

## 2014-11-06 NOTE — [facility] Suicide Risk Assessment (Signed)
Demographic Factors:  29 year old male, divorced, has one child, employed   Total Time spent with patient: 30 minutes  Psychiatric Specialty Exam: Physical Exam  ROS  Blood pressure 92/56, pulse 84, temperature 97.9 F (36.6 C), temperature source Oral, resp. rate 20, height 6\' 1"  (1.854 m), weight 130 lb (58.968 kg).Body mass index is 17.16 kg/(m^2).  General Appearance: Casual  Eye Contact::  Good  Speech:  Normal Rate  Volume:  Normal  Mood:  improved and currently euthymic  Affect:  Appropriate and  reactive  Thought Process:  Linear  Orientation:  Full (Time, Place, and Person)  Thought Content:  no hallucinations, no delusions  Suicidal Thoughts:  No- at this time denies any suicidal or homicidal ideations and denies any self injurious ideations  Homicidal Thoughts:  No  Memory: Recent and Remote grossly intact  Judgement:  Good  Insight:  Good  Psychomotor Activity:  Normal  Concentration:  Good  Recall:  Good  Fund of Knowledge:Good  Language: Good  Akathisia:  Negative  Handed:  Right  AIMS (if indicated):     Assets:  Desire for Improvement Physical Health Resilience Social Support  Sleep:  Number of Hours: 6    Musculoskeletal: Strength & Muscle Tone: within normal limits Gait & Station: normal Patient leans: N/A   Mental Status Per Nursing Assessment::   On Admission:     Current Mental Status by Physician: As noted above, at this time patient presents much improved, with a euthymic mood and a full range of affect, no thought disorder, no SI or HI, and future oriented, looking forward to returning to work and seeing his family. He states he is considering decreasing his work load so he can have more time for sleep and for himself  Loss Factors: Divorce, financial stressors, working two jobs   Historical Factors: He denies any prior psychiatric history or any prior history of severe depression or suicide attempts   Risk Reduction Factors:    Responsible for children under 518 years of age, Sense of responsibility to family, Employed and Positive social support  Continued Clinical Symptoms:  At this time euthymic, full range of affect, no SI or HI, no psychotic symptoms   Cognitive Features That Contribute To Risk:  No gross cognitive deficits noted upon discharge. Is alert , attentive, and oriented x 3   Suicide Risk:  Mild:  Suicidal ideation of limited frequency, intensity, duration, and specificity.  There are no identifiable plans, no associated intent, mild dysphoria and related symptoms, good self-control (both objective and subjective assessment), few other risk factors, and identifiable protective factors, including available and accessible social support.  Discharge Diagnoses:   AXIS I:  Adjustment Disorder With Depressed Mood  AXIS II:  Deferred AXIS III:  History reviewed. No pertinent past medical history. AXIS IV:  occupational problems AXIS V:  61-70 mild symptoms  Plan Of Care/Follow-up recommendations:  Activity:  As tolerated Diet:  regular Tests:  NA Other:  See below  Is patient on multiple antipsychotic therapies at discharge:  No   Has Patient had three or more failed trials of antipsychotic monotherapy by history:  No  Recommended Plan for Multiple Antipsychotic Therapies: NA  Patient is requesting discharge and there are no current grounds for involuntary commitment. He is leaving unit in good spirits. He plans to return home. Follow Up as Below- Follow-up Information    Follow up with [facility].   Why: Walk in between 8am-9am Monday through Friday  for hospital follow-up/medication management/assessment for therapy services.    Contact information:   [address], [place] [address] Phone: [phone] Fax: [phone]     COBOS, Madaline Guthrie 11/06/2014, 1:20 PM

## 2014-11-06 NOTE — Progress Notes (Signed)
Pt did not attend the AA speaker meeting. Pt was in his room sleeping.  Christopher Sharp, Tinsleigh Slovacek C, NT

## 2014-11-06 NOTE — Progress Notes (Signed)
[facility] :  Will you be returning to the same living situation after discharge: Yes,  home At discharge, do you have transportation home?:Yes,  security [facility] Do you have the ability to pay for your medications:Yes,  mental health  Release of information consent forms completed and submitted to Medical Records by CSW.  Patient to Follow up at: Follow-up Information    Follow up with [facility].   Why:  Walk in between 8am-9am Monday through Friday for hospital follow-up/medication management/assessment for therapy services.    Contact information:   [address] [place], [place] [address] Phone: [phone] Fax: [phone]      Patient denies SI/HI:   Yes,  during self report.     Safety Planning and Suicide Prevention discussed:  Yes,  Contact attempts made with pt's girlfriend. SPE completed with pt and he was encouraged to share information with support network, ask questions, and talk about any concerns about SPE  Patient refused referral  Smart, Lebron QuamHeather LCSWA  11/06/2014, 10:33 AM

## 2014-11-06 NOTE — Discharge Summary (Signed)
Physician Discharge Summary Note  Patient:  Christopher Sharp is an 29 y.o., male MRN:  [id number] DOB:  October 06, 1986 Patient phone:  [phone] (home)  Patient address:   [address] [address],  Total Time spent with patient: Greater than 30 minutes  Date of Admission:  11/05/2014  Date of Discharge: 11/06/13  Reason for Admission: Suicide attempt  Discharge Diagnoses: Active Problems:   MDD (major depressive disorder)  Psychiatric Specialty Exam: Physical Exam  Psychiatric: His speech is normal and behavior is normal. Judgment and thought content normal. His mood appears not anxious. His affect is not angry, not blunt, not labile and not inappropriate. Cognition and memory are normal. He does not exhibit a depressed mood.    Review of Systems  Constitutional: Negative.   HENT: Negative.   Eyes: Negative.   Respiratory: Negative.   Cardiovascular: Negative.   Gastrointestinal: Negative.   Genitourinary: Negative.   Musculoskeletal: Negative.   Skin: Negative.   Neurological: Negative.   Endo/Heme/Allergies: Negative.   Psychiatric/Behavioral: Positive for depression (Stable). Negative for suicidal ideas, hallucinations, memory loss and substance abuse. The patient is not nervous/anxious and does not have insomnia.     Blood pressure 92/56, pulse 84, temperature 97.9 F (36.6 C), temperature source Oral, resp. rate 20, height '6\' 1"'  (1.854 m), weight 58.968 kg (130 lb).Body mass index is 17.16 kg/(m^2).  See medication lists     Past Psychiatric History: Diagnosis: "Stressed, not depressed"  Hospitalizations: None reported  Outpatient Care: None reported  Substance Abuse Care: Denies  Self-Mutilation: Denies  Suicidal Attempts: "Yes, by overdose"  Violent Behaviors: Denies   Musculoskeletal: Strength & Muscle Tone: within normal limits Gait & Station: normal Patient leans: N/A  DSM5: Schizophrenia Disorders:  NA Obsessive-Compulsive Disorders:   NA Trauma-Stressor Disorders:  NA Substance/Addictive Disorders:  NA Depressive Disorders:  MDD (major depressive disorder)   Axis Diagnosis:  AXIS I:  MDD (major depressive disorder) AXIS II:  Deferred AXIS III:  History reviewed. No pertinent past medical history. AXIS IV:  economic problems and other psychosocial or environmental problems AXIS V:  64  Level of Care:  OP  Hospital Course: Christopher Sharp is a 29 year old African-American male. Admitted to [facility] from the [facility] ED. He reports, "My problems started last June. I have been going through a lot, got married June of 2014, legally separated since March of 2015. My wife and I had a daughter together. I'm working too jobs just to make ends meet and to support my daughter. I'm unable to get ahead. I feel very stressed. Started losing weight because I was not eating regularly because I don't have an appetite. Then I started feeling like, may be I will be better of not being here. I got frustrated, took some Ibuprofen pills. Then realized what I had done after talking to my daughter. I drove myself to the hospital to get help. I feel better now.  Christopher Sharp's stay in this hospital was rather very brief. He was admitted to the hospital after impulsively taken 8 tablets of Ibuprofen in an instant he felt, may be he will be better of not be here. During his admission assessment/evaluation, he denies any thoughts of killing himself and denies any history of depression and or suicidal ideations/attempts. Rather, he reported feeling stressed working 2 jobs just to make ends meet. He also stated feeling a lot better and is ready to be discharged to go back to his home where he has a lot  of support. Christopher Sharp declines to be on any depression medicines, but agreed on counseling and talk therapy which he believes he will get from being open to family members and his girl-friend.  He was enrolled in the group counseling sessions being offered and held on this  unit to learn coping skills that should help him cope better after discharge.   After spending just a day on the inpatient unit, Christopher Sharp presented with a decreased symptoms of depression. This is evidenced by his reports of improved mood and absence of suicidal ideations. He is currently being discharged to follow-up care at the [facility], here in [place], [place]. He is provided with all the necessary information required to make this appointment without problems.  Upon discharge, he adamantly denies any suicidal, homicidal ideations, auditory, visual hallucinations, delusional thoughts and or paranoia. He left [facility] with all personal belongings in no apparent distress. Transportation per patient's arrangement.  Consults:  psychiatry  Significant Diagnostic Studies:  labs: CBC with diff, CMP, UDS, toxicology tests, U/A, results reviewed, stable  Discharge Vitals:   Blood pressure 92/56, pulse 84, temperature 97.9 F (36.6 C), temperature source Oral, resp. rate 20, height '6\' 1"'  (1.854 m), weight 58.968 kg (130 lb). Body mass index is 17.16 kg/(m^2). Lab Results:   Results for orders placed or performed during the hospital encounter of 11/03/14 (from the past 72 hour(s))  Acetaminophen level     Status: Abnormal   Collection Time: 11/03/14  4:00 PM  Result Value Ref Range   Acetaminophen (Tylenol), Serum <10.0 (L) 10 - 30 ug/mL    Comment:        THERAPEUTIC CONCENTRATIONS VARY SIGNIFICANTLY. A RANGE OF 10-30 ug/mL MAY BE AN EFFECTIVE CONCENTRATION FOR MANY PATIENTS. HOWEVER, SOME ARE BEST TREATED AT CONCENTRATIONS OUTSIDE THIS RANGE. ACETAMINOPHEN CONCENTRATIONS >150 ug/mL AT 4 HOURS AFTER INGESTION AND >50 ug/mL AT 12 HOURS AFTER INGESTION ARE OFTEN ASSOCIATED WITH TOXIC REACTIONS.   Comprehensive metabolic panel     Status: Abnormal   Collection Time: 11/03/14  4:00 PM  Result Value Ref Range   Sodium 140 135 - 145 mmol/L    Comment: Please note change in reference range.    Potassium 3.9 3.5 - 5.1 mmol/L    Comment: Please note change in reference range.   Chloride 106 96 - 112 mEq/L   CO2 17 (L) 19 - 32 mmol/L   Glucose, Bld 99 70 - 99 mg/dL   BUN 13 6 - 23 mg/dL   Creatinine, Ser 0.98 0.50 - 1.35 mg/dL   Calcium 9.5 8.4 - 10.5 mg/dL   Total Protein 8.0 6.0 - 8.3 g/dL   Albumin 5.1 3.5 - 5.2 g/dL   AST 34 0 - 37 U/L   ALT 18 0 - 53 U/L   Alkaline Phosphatase 50 39 - 117 U/L   Total Bilirubin 2.8 (H) 0.3 - 1.2 mg/dL   GFR calc non Af Amer >90 >90 mL/min   GFR calc Af Amer >90 >90 mL/min    Comment: (NOTE) The eGFR has been calculated using the CKD EPI equation. This calculation has not been validated in all clinical situations. eGFR's persistently <90 mL/min signify possible Chronic Kidney Disease.    Anion gap 17 (H) 5 - 15  Ethanol     Status: None   Collection Time: 11/03/14  4:00 PM  Result Value Ref Range   Alcohol, Ethyl (B) <5 0 - 9 mg/dL    Comment:  LOWEST DETECTABLE LIMIT FOR SERUM ALCOHOL IS 11 mg/dL FOR MEDICAL PURPOSES ONLY   Salicylate level     Status: None   Collection Time: 11/03/14  4:00 PM  Result Value Ref Range   Salicylate Lvl <1.6 2.8 - 20.0 mg/dL  CBC with Differential     Status: None   Collection Time: 11/03/14  4:00 PM  Result Value Ref Range   WBC 4.3 4.0 - 10.5 K/uL   RBC 4.54 4.22 - 5.81 MIL/uL   Hemoglobin 14.2 13.0 - 17.0 g/dL   HCT 41.6 39.0 - 52.0 %   MCV 91.6 78.0 - 100.0 fL   MCH 31.3 26.0 - 34.0 pg   MCHC 34.1 30.0 - 36.0 g/dL   RDW 13.7 11.5 - 15.5 %   Platelets 237 150 - 400 K/uL   Neutrophils Relative % 53 43 - 77 %   Neutro Abs 2.3 1.7 - 7.7 K/uL   Lymphocytes Relative 37 12 - 46 %   Lymphs Abs 1.6 0.7 - 4.0 K/uL   Monocytes Relative 8 3 - 12 %   Monocytes Absolute 0.3 0.1 - 1.0 K/uL   Eosinophils Relative 1 0 - 5 %   Eosinophils Absolute 0.0 0.0 - 0.7 K/uL   Basophils Relative 1 0 - 1 %   Basophils Absolute 0.0 0.0 - 0.1 K/uL  CK     Status: Abnormal   Collection Time: 11/03/14   4:00 PM  Result Value Ref Range   Total CK 235 (H) 7 - 232 U/L  CBG monitoring, ED     Status: None   Collection Time: 11/03/14  4:20 PM  Result Value Ref Range   Glucose-Capillary 84 70 - 99 mg/dL  Protime-INR     Status: None   Collection Time: 11/03/14  4:30 PM  Result Value Ref Range   Prothrombin Time 14.8 11.6 - 15.2 seconds   INR 1.14 0.00 - 1.49  Lactic acid, plasma     Status: Abnormal   Collection Time: 11/03/14  4:30 PM  Result Value Ref Range   Lactic Acid, Venous 7.6 (H) 0.5 - 2.2 mmol/L  I-Stat Chem 8, ED     Status: None   Collection Time: 11/03/14  4:43 PM  Result Value Ref Range   Sodium 141 135 - 145 mmol/L   Potassium 3.8 3.5 - 5.1 mmol/L   Chloride 102 96 - 112 mEq/L   BUN 15 6 - 23 mg/dL   Creatinine, Ser 0.90 0.50 - 1.35 mg/dL   Glucose, Bld 95 70 - 99 mg/dL   Calcium, Ion 1.23 1.12 - 1.23 mmol/L   TCO2 18 0 - 100 mmol/L   Hemoglobin 16.0 13.0 - 17.0 g/dL   HCT 47.0 39.0 - 52.0 %  I-Stat Venous Blood Gas, ED (order at [facility] and [facility] only)     Status: Abnormal   Collection Time: 11/03/14  4:44 PM  Result Value Ref Range   pH, Ven 7.260 7.250 - 7.300   pCO2, Ven 46.8 45.0 - 50.0 mmHg   pO2, Ven 26.0 (LL) 30.0 - 45.0 mmHg   Bicarbonate 21.2 20.0 - 24.0 mEq/L   TCO2 23 0 - 100 mmol/L   O2 Saturation 40.0 %   Acid-base deficit 6.0 (H) 0.0 - 2.0 mmol/L   Patient temperature 97.5 F    Sample type VENOUS    Comment NOTIFIED PHYSICIAN   Acetaminophen level     Status: Abnormal   Collection Time: 11/03/14  8:00 PM  Result  Value Ref Range   Acetaminophen (Tylenol), Serum <10.0 (L) 10 - 30 ug/mL    Comment:        THERAPEUTIC CONCENTRATIONS VARY SIGNIFICANTLY. A RANGE OF 10-30 ug/mL MAY BE AN EFFECTIVE CONCENTRATION FOR MANY PATIENTS. HOWEVER, SOME ARE BEST TREATED AT CONCENTRATIONS OUTSIDE THIS RANGE. ACETAMINOPHEN CONCENTRATIONS >150 ug/mL AT 4 HOURS AFTER INGESTION AND >50 ug/mL AT 12 HOURS AFTER INGESTION ARE OFTEN ASSOCIATED WITH  TOXIC REACTIONS.   Drug screen panel, emergency     Status: None   Collection Time: 11/03/14  8:06 PM  Result Value Ref Range   Opiates NONE DETECTED NONE DETECTED   Cocaine NONE DETECTED NONE DETECTED   Benzodiazepines NONE DETECTED NONE DETECTED   Amphetamines NONE DETECTED NONE DETECTED   Tetrahydrocannabinol NONE DETECTED NONE DETECTED   Barbiturates NONE DETECTED NONE DETECTED    Comment:        DRUG SCREEN FOR MEDICAL PURPOSES ONLY.  IF CONFIRMATION IS NEEDED FOR ANY PURPOSE, NOTIFY LAB WITHIN 5 DAYS.        LOWEST DETECTABLE LIMITS FOR URINE DRUG SCREEN Drug Class       Cutoff (ng/mL) Amphetamine      1000 Barbiturate      200 Benzodiazepine   154 Tricyclics       008 Opiates          300 Cocaine          300 THC              50   Lactic acid, plasma     Status: None   Collection Time: 11/03/14  8:08 PM  Result Value Ref Range   Lactic Acid, Venous 1.7 0.5 - 2.2 mmol/L  Basic metabolic panel     Status: None   Collection Time: 11/03/14  9:20 PM  Result Value Ref Range   Sodium 140 135 - 145 mmol/L    Comment: Please note change in reference range.   Potassium 3.6 3.5 - 5.1 mmol/L    Comment: Please note change in reference range.   Chloride 108 96 - 112 mEq/L   CO2 25 19 - 32 mmol/L   Glucose, Bld 98 70 - 99 mg/dL   BUN 9 6 - 23 mg/dL   Creatinine, Ser 1.00 0.50 - 1.35 mg/dL   Calcium 9.5 8.4 - 10.5 mg/dL   GFR calc non Af Amer >90 >90 mL/min   GFR calc Af Amer >90 >90 mL/min    Comment: (NOTE) The eGFR has been calculated using the CKD EPI equation. This calculation has not been validated in all clinical situations. eGFR's persistently <90 mL/min signify possible Chronic Kidney Disease.    Anion gap 7 5 - 15    Physical Findings: AIMS: Facial and Oral Movements Muscles of Facial Expression: None, normal Lips and Perioral Area: None, normal Jaw: None, normal Tongue: None, normal,Extremity Movements Upper (arms, wrists, hands, fingers): None,  normal Lower (legs, knees, ankles, toes): None, normal, Trunk Movements Neck, shoulders, hips: None, normal, Overall Severity Severity of abnormal movements (highest score from questions above): None, normal Incapacitation due to abnormal movements: None, normal Patient's awareness of abnormal movements (rate only patient's report): No Awareness, Dental Status Current problems with teeth and/or dentures?: No Does patient usually wear dentures?: No  CIWA:  CIWA-Ar Total: 1 COWS:     Psychiatric Specialty Exam: See Psychiatric Specialty Exam and Suicide Risk Assessment completed by Attending Physician prior to discharge.  Discharge destination:  Home  Is patient on  multiple antipsychotic therapies at discharge:  No   Has Patient had three or more failed trials of antipsychotic monotherapy by history:  No  Recommended Plan for Multiple Antipsychotic Therapies: NA    Medication List    STOP taking these medications        multivitamin with minerals Tabs tablet       Follow-up Information    Follow up with [facility].   Why:  Walk in between 8am-9am Monday through Friday for hospital follow-up/medication management/assessment for therapy services.    Contact information:   [address], [place] [address] Phone: [phone] Fax: [phone]     Follow-up recommendations: Activity:  As tolerated Diet: As recommended by your primary care doctor. Keep all scheduled follow-up appointments as recommended.    Comments: Take all your medications as prescribed by your mental healthcare provider. Report any adverse effects and or reactions from your medicines to your outpatient provider promptly. Patient is instructed and cautioned to not engage in alcohol and or illegal drug use while on prescription medicines. In the event of worsening symptoms, patient is instructed to call the crisis hotline, 911 and or go to the nearest ED for appropriate evaluation and treatment of  symptoms. Follow-up with your primary care provider for your other medical issues, concerns and or health care needs.    Total Discharge Time:  Greater than 30 minutes.  Signed: Encarnacion Slates, PMHNP-BC 11/06/2014, 10:16 AM   Patient seen, Suicide Assessment Completed.  Disposition Plan Reviewed

## 2014-11-06 NOTE — [facility] Group Notes (Deleted)
[facility] Group Notes:  (Nursing/MHT/Case Management/Adjunct)  Date:  11/06/2014  Time:  0915  Type of Therapy:  Psychoeducational Skills  Participation Level:  Did Not Attend  Participation Quality:  n/a  Affect: n/a  Cognitive: n/a  Insight: n/a  Engagement in Group: n/a  Modes of Intervention:  Discussion, Education, Exploration and Support  Summary of Progress/Problems: Pt did not attend group. Pt in bed asleep.  Aurora Maskwyman, Teniola Tseng E 11/06/2014, 11:26 AM

## 2014-11-10 NOTE — Progress Notes (Signed)
Patient Discharge Instructions:  After Visit Summary (AVS):   Faxed to:  11/10/14 Discharge Summary Note:   Faxed to:  11/10/14 Psychiatric Admission Assessment Note:   Faxed to:  11/10/14 Suicide Risk Assessment - Discharge Assessment:   Faxed to:  11/10/14 Faxed/Sent to the Next Level Care provider:  11/10/14 [facility] @ [phone]  Jerelene ReddenSheena E Castle, 11/10/2014, 2:31 PM

## 2015-03-07 ENCOUNTER — Emergency Department (HOSPITAL_COMMUNITY)
Admission: EM | Admit: 2015-03-07 | Discharge: 2015-03-07 | Disposition: A | Discharge status: Home / Self Care | Payer: Self-pay | Attending: Emergency Medicine | Admitting: Emergency Medicine

## 2015-03-07 ENCOUNTER — Encounter (HOSPITAL_COMMUNITY): Payer: Self-pay | Admitting: Emergency Medicine

## 2015-03-07 DIAGNOSIS — R51 Headache: Secondary | ICD-10-CM | POA: Insufficient documentation

## 2015-03-07 DIAGNOSIS — H53149 Visual discomfort, unspecified: Secondary | ICD-10-CM | POA: Insufficient documentation

## 2015-03-07 DIAGNOSIS — R519 Headache, unspecified: Secondary | ICD-10-CM

## 2015-03-07 HISTORY — DX: Headache: R51

## 2015-03-07 HISTORY — DX: Headache, unspecified: R51.9

## 2015-03-07 MED ORDER — SODIUM CHLORIDE 0.9 % IV SOLN
1000.0000 mL | Freq: Once | INTRAVENOUS | Status: AC
  Administered 2015-03-07: 1000 mL via INTRAVENOUS

## 2015-03-07 MED ORDER — DIPHENHYDRAMINE HCL 50 MG/ML IJ SOLN
25.0000 mg | Freq: Once | INTRAMUSCULAR | Status: AC
  Administered 2015-03-07: 25 mg via INTRAVENOUS
  Filled 2015-03-07: qty 1

## 2015-03-07 MED ORDER — METOCLOPRAMIDE HCL 10 MG PO TABS: 10.0000 mg | ORAL_TABLET | Freq: Four times a day (QID) | ORAL | Status: DC | PRN

## 2015-03-07 MED ORDER — DEXAMETHASONE SODIUM PHOSPHATE 10 MG/ML IJ SOLN
10.0000 mg | Freq: Once | INTRAMUSCULAR | Status: AC
  Administered 2015-03-07: 10 mg via INTRAVENOUS
  Filled 2015-03-07: qty 1

## 2015-03-07 MED ORDER — SODIUM CHLORIDE 0.9 % IV SOLN: 1000.0000 mL | INTRAVENOUS | Status: DC

## 2015-03-07 MED ORDER — METOCLOPRAMIDE HCL 5 MG/ML IJ SOLN
10.0000 mg | Freq: Once | INTRAMUSCULAR | Status: AC
  Administered 2015-03-07: 10 mg via INTRAVENOUS
  Filled 2015-03-07: qty 2

## 2015-03-07 NOTE — Discharge Instructions (Signed)
General Headache Without Cause °A headache is pain or discomfort felt around the head or neck area. The specific cause of a headache may not be found. There are many causes and types of headaches. A few common ones are: °· Tension headaches. °· Migraine headaches. °· Cluster headaches. °· Chronic daily headaches. °HOME CARE INSTRUCTIONS  °· Keep all follow-up appointments with your caregiver or any specialist referral. °· Only take over-the-counter or prescription medicines for pain or discomfort as directed by your caregiver. °· Lie down in a dark, quiet room when you have a headache. °· Keep a headache journal to find out what may trigger your migraine headaches. For example, write down: °¨ What you eat and drink. °¨ How much sleep you get. °¨ Any change to your diet or medicines. °· Try massage or other relaxation techniques. °· Put ice packs or heat on the head and neck. Use these 3 to 4 times per day for 15 to 20 minutes each time, or as needed. °· Limit stress. °· Sit up straight, and do not tense your muscles. °· Quit smoking if you smoke. °· Limit alcohol use. °· Decrease the amount of caffeine you drink, or stop drinking caffeine. °· Eat and sleep on a regular schedule. °· Get 7 to 9 hours of sleep, or as recommended by your caregiver. °· Keep lights dim if bright lights bother you and make your headaches worse. °SEEK MEDICAL CARE IF:  °· You have problems with the medicines you were prescribed. °· Your medicines are not working. °· You have a change from the usual headache. °· You have nausea or vomiting. °SEEK IMMEDIATE MEDICAL CARE IF:  °· Your headache becomes severe. °· You have a fever. °· You have a stiff neck. °· You have loss of vision. °· You have muscular weakness or loss of muscle control. °· You start losing your balance or have trouble walking. °· You feel faint or pass out. °· You have severe symptoms that are different from your first symptoms. °MAKE SURE YOU:  °· Understand these  instructions. °· Will watch your condition. °· Will get help right away if you are not doing well or get worse. °Document Released: 10/17/2005 Document Revised: 01/09/2012 Document Reviewed: 11/02/2011 °ExitCare® Patient Information ©2015 [employer]. This information is not intended to replace advice given to you by your health care provider. Make sure you discuss any questions you have with your health care provider. ° °Metoclopramide tablets °What is this medicine? °METOCLOPRAMIDE (met oh kloe PRA mide) is used to treat the symptoms of gastroesophageal reflux disease (GERD) like heartburn. It is also used to treat people with slow emptying of the stomach and intestinal tract. °This medicine may be used for other purposes; ask your health care provider or pharmacist if you have questions. °COMMON BRAND NAME(S): Reglan °What should I tell my health care provider before I take this medicine? °They need to know if you have any of these conditions: °-breast cancer °-depression °-diabetes °-heart failure °-high blood pressure °-kidney disease °-liver disease °-Parkinson's disease or a movement disorder °-pheochromocytoma °-seizures °-stomach obstruction, bleeding, or perforation °-an unusual or allergic reaction to metoclopramide, procainamide, sulfites, other medicines, foods, dyes, or preservatives °-pregnant or trying to get pregnant °-breast-feeding °How should I use this medicine? °Take this medicine by mouth with a glass of water. Follow the directions on the prescription label. Take this medicine on an empty stomach, about 30 minutes before eating. Take your doses at regular intervals. Do not take   your medicine more often than directed. Do not stop taking except on the advice of your doctor or health care professional. A special MedGuide will be given to you by the pharmacist with each prescription and refill. Be sure to read this information carefully each time. Talk to your pediatrician regarding the use  of this medicine in children. Special care may be needed. Overdosage: If you think you have taken too much of this medicine contact a poison control center or emergency room at once. NOTE: This medicine is only for you. Do not share this medicine with others. What if I miss a dose? If you miss a dose, take it as soon as you can. If it is almost time for your next dose, take only that dose. Do not take double or extra doses. What may interact with this medicine? -acetaminophen -cyclosporine -digoxin -medicines for blood pressure -medicines for diabetes, including insulin -medicines for hay fever and other allergies -medicines for depression, especially an Monoamine Oxidase Inhibitor (MAOI) -medicines for Parkinson's disease, like levodopa -medicines for sleep or for pain -tetracycline This list may not describe all possible interactions. Give your health care provider a list of all the medicines, herbs, non-prescription drugs, or dietary supplements you use. Also tell them if you smoke, drink alcohol, or use illegal drugs. Some items may interact with your medicine. What should I watch for while using this medicine? It may take a few weeks for your stomach condition to start to get better. However, do not take this medicine for longer than 12 weeks. The longer you take this medicine, and the more you take it, the greater your chances are of developing serious side effects. If you are an elderly patient, a male patient, or you have diabetes, you may be at an increased risk for side effects from this medicine. Contact your doctor immediately if you start having movements you cannot control such as lip smacking, rapid movements of the tongue, involuntary or uncontrollable movements of the eyes, head, arms and legs, or muscle twitches and spasms. Patients and their families should watch out for worsening depression or thoughts of suicide. Also watch out for any sudden or severe changes in feelings  such as feeling anxious, agitated, panicky, irritable, hostile, aggressive, impulsive, severely restless, overly excited and hyperactive, or not being able to sleep. If this happens, especially at the beginning of treatment or after a change in dose, call your doctor. Do not treat yourself for high fever. Ask your doctor or health care professional for advice. You may get drowsy or dizzy. Do not drive, use machinery, or do anything that needs mental alertness until you know how this drug affects you. Do not stand or sit up quickly, especially if you are an older patient. This reduces the risk of dizzy or fainting spells. Alcohol can make you more drowsy and dizzy. Avoid alcoholic drinks. What side effects may I notice from receiving this medicine? Side effects that you should report to your doctor or health care professional as soon as possible: -allergic reactions like skin rash, itching or hives, swelling of the face, lips, or tongue -abnormal production of milk in females -breast enlargement in both males and females -change in the way you walk -difficulty moving, speaking or swallowing -drooling, lip smacking, or rapid movements of the tongue -excessive sweating -fever -involuntary or uncontrollable movements of the eyes, head, arms and legs -irregular heartbeat or palpitations -muscle twitches and spasms -unusually weak or tired Side effects that usually do not  require medical attention (report to your doctor or health care professional if they continue or are bothersome): -change in sex drive or performance -depressed mood -diarrhea -difficulty sleeping -headache -menstrual changes -restless or nervous This list may not describe all possible side effects. Call your doctor for medical advice about side effects. You may report side effects to FDA at 1-800-FDA-1088. Where should I keep my medicine? Keep out of the reach of children. Store at room temperature between 20 and 25 degrees C  (68 and 77 degrees F). Protect from light. Keep container tightly closed. Throw away any unused medicine after the expiration date. NOTE: This sheet is a summary. It may not cover all possible information. If you have questions about this medicine, talk to your doctor, pharmacist, or health care provider.  2015, [employer] Standard. (2012-02-14 13:04:38)
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
## 2015-03-14 ENCOUNTER — Emergency Department (HOSPITAL_COMMUNITY)
Admission: EM | Admit: 2015-03-14 | Discharge: 2015-03-14 | Disposition: A | Discharge status: Home / Self Care | Payer: Self-pay | Attending: Emergency Medicine | Admitting: Emergency Medicine

## 2015-03-14 ENCOUNTER — Encounter (HOSPITAL_COMMUNITY): Payer: Self-pay | Admitting: Family Medicine

## 2015-03-14 DIAGNOSIS — S60812A Abrasion of left wrist, initial encounter: Secondary | ICD-10-CM | POA: Insufficient documentation

## 2015-03-14 DIAGNOSIS — Y9389 Activity, other specified: Secondary | ICD-10-CM | POA: Insufficient documentation

## 2015-03-14 DIAGNOSIS — G588 Other specified mononeuropathies: Secondary | ICD-10-CM | POA: Insufficient documentation

## 2015-03-14 DIAGNOSIS — S60811A Abrasion of right wrist, initial encounter: Secondary | ICD-10-CM | POA: Insufficient documentation

## 2015-03-14 DIAGNOSIS — G589 Mononeuropathy, unspecified: Secondary | ICD-10-CM

## 2015-03-14 DIAGNOSIS — Y998 Other external cause status: Secondary | ICD-10-CM | POA: Insufficient documentation

## 2015-03-14 DIAGNOSIS — Y9289 Other specified places as the place of occurrence of the external cause: Secondary | ICD-10-CM | POA: Insufficient documentation

## 2015-03-14 MED ORDER — PREDNISONE 10 MG PO TABS: 40.0000 mg | ORAL_TABLET | Freq: Every day | ORAL | Status: DC
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
## 2015-03-14 NOTE — Discharge Instructions (Signed)
Keep the wrist splints on all the time except when showering. Prednisone as prescribed until all gone for inflammation. If symptoms not improving in several days, please follow-up with Dr. Merlyn LotKuzma is referred. Return if any new weakness in the hands.
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
## 2015-08-14 DIAGNOSIS — Z7952 Long term (current) use of systemic steroids: Secondary | ICD-10-CM | POA: Insufficient documentation

## 2015-08-14 DIAGNOSIS — Y999 Unspecified external cause status: Secondary | ICD-10-CM | POA: Insufficient documentation

## 2015-08-14 DIAGNOSIS — W1839XA Other fall on same level, initial encounter: Secondary | ICD-10-CM | POA: Insufficient documentation

## 2015-08-14 DIAGNOSIS — Y9289 Other specified places as the place of occurrence of the external cause: Secondary | ICD-10-CM | POA: Insufficient documentation

## 2015-08-14 DIAGNOSIS — Z87828 Personal history of other (healed) physical injury and trauma: Secondary | ICD-10-CM | POA: Insufficient documentation

## 2015-08-14 DIAGNOSIS — S99921A Unspecified injury of right foot, initial encounter: Secondary | ICD-10-CM | POA: Insufficient documentation

## 2015-08-14 DIAGNOSIS — Y9389 Activity, other specified: Secondary | ICD-10-CM | POA: Insufficient documentation

## 2015-08-15 ENCOUNTER — Encounter (HOSPITAL_COMMUNITY): Payer: Self-pay

## 2015-08-15 ENCOUNTER — Emergency Department (HOSPITAL_COMMUNITY): Payer: Self-pay

## 2015-08-15 ENCOUNTER — Emergency Department (HOSPITAL_COMMUNITY)
Admission: EM | Admit: 2015-08-15 | Discharge: 2015-08-15 | Disposition: A | Discharge status: Home / Self Care | Payer: Self-pay | Attending: Emergency Medicine | Admitting: Emergency Medicine

## 2015-08-15 DIAGNOSIS — S99929A Unspecified injury of unspecified foot, initial encounter: Secondary | ICD-10-CM

## 2015-08-15 NOTE — ED Notes (Signed)
Patient transported to X-ray 
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
## 2015-08-15 NOTE — ED Notes (Signed)
MD at bedside. Dr. Mora Bellmanni
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
## 2015-08-15 NOTE — Discharge Instructions (Signed)
Muscle Pain, Adult Christopher Sharp, take ibuprofen or Tylenol as needed for your pain. See orthopaedic surgery within 3 days for close follow-up. If any symptoms worsen come back to emergency department immediately. Thank you. Muscle pain (myalgia) may be caused by many things, including:  Overuse or muscle strain, especially if you are not in shape. This is the most common cause of muscle pain.  Injury.  Bruises.  Viruses, such as the flu.  Infectious diseases.  Fibromyalgia, which is a chronic condition that causes muscle tenderness, fatigue, and headache.  Autoimmune diseases, including lupus.  Certain drugs, including ACE inhibitors and statins. Muscle pain may be mild or severe. In most cases, the pain lasts only a short time and goes away without treatment. To diagnose the cause of your muscle pain, your health care provider will take your medical history. This means he or she will ask you when your muscle pain began and what has been happening. If you have not had muscle pain for very long, your health care provider may want to wait before doing much testing. If your muscle pain has lasted a long time, your health care provider may want to run tests right away. If your health care provider thinks your muscle pain may be caused by illness, you may need to have additional tests to rule out certain conditions.  Treatment for muscle pain depends on the cause. Home care is often enough to relieve muscle pain. Your health care provider may also prescribe anti-inflammatory medicine. HOME CARE INSTRUCTIONS Watch your condition for any changes. The following actions may help to lessen any discomfort you are feeling:  Only take over-the-counter or prescription medicines as directed by your health care provider.  Apply ice to the sore muscle:  Put ice in a plastic bag.  Place a towel between your skin and the bag.  Leave the ice on for 15-20 minutes, 3-4 times a day.  You may alternate  applying hot and cold packs to the muscle as directed by your health care provider.  If overuse is causing your muscle pain, slow down your activities until the pain goes away.  Remember that it is normal to feel some muscle pain after starting a workout program. Muscles that have not been used often will be sore at first.  Do regular, gentle exercises if you are not usually active.  Warm up before exercising to lower your risk of muscle pain.  Do not continue working out if the pain is very bad. Bad pain could mean you have injured a muscle. SEEK MEDICAL CARE IF:  Your muscle pain gets worse, and medicines do not help.  You have muscle pain that lasts longer than 3 days.  You have a rash or fever along with muscle pain.  You have muscle pain after a tick bite.  You have muscle pain while working out, even though you are in good physical condition.  You have redness, soreness, or swelling along with muscle pain.  You have muscle pain after starting a new medicine or changing the dose of a medicine. SEEK IMMEDIATE MEDICAL CARE IF:  You have trouble breathing.  You have trouble swallowing.  You have muscle pain along with a stiff neck, fever, and vomiting.  You have severe muscle weakness or cannot move part of your body. MAKE SURE YOU:   Understand these instructions.  Will watch your condition.  Will get help right away if you are not doing well or get worse.   This  information is not intended to replace advice given to you by your health care provider. Make sure you discuss any questions you have with your health care provider.   Document Released: 09/08/2006 Document Revised: 11/07/2014 Document Reviewed: 08/13/2013 Elsevier Interactive Patient Education [employer]2016 [employer]

## 2016-01-17 ENCOUNTER — Emergency Department (HOSPITAL_COMMUNITY): Payer: Self-pay

## 2016-01-17 ENCOUNTER — Emergency Department (HOSPITAL_COMMUNITY)
Admission: EM | Admit: 2016-01-17 | Discharge: 2016-01-17 | Disposition: A | Discharge status: Home / Self Care | Payer: Self-pay | Attending: Emergency Medicine | Admitting: Emergency Medicine

## 2016-01-17 ENCOUNTER — Encounter (HOSPITAL_COMMUNITY): Payer: Self-pay | Admitting: Emergency Medicine

## 2016-01-17 DIAGNOSIS — S01111A Laceration without foreign body of right eyelid and periocular area, initial encounter: Secondary | ICD-10-CM | POA: Insufficient documentation

## 2016-01-17 DIAGNOSIS — Y9389 Activity, other specified: Secondary | ICD-10-CM | POA: Insufficient documentation

## 2016-01-17 DIAGNOSIS — Y9289 Other specified places as the place of occurrence of the external cause: Secondary | ICD-10-CM | POA: Insufficient documentation

## 2016-01-17 DIAGNOSIS — Y998 Other external cause status: Secondary | ICD-10-CM | POA: Insufficient documentation

## 2016-01-17 DIAGNOSIS — S0083XA Contusion of other part of head, initial encounter: Secondary | ICD-10-CM

## 2016-01-17 DIAGNOSIS — IMO0002 Reserved for concepts with insufficient information to code with codable children: Secondary | ICD-10-CM

## 2016-01-17 MED ORDER — OXYCODONE-ACETAMINOPHEN 5-325 MG PO TABS
1.0000 | ORAL_TABLET | Freq: Once | ORAL | Status: AC
  Administered 2016-01-17: 1 via ORAL
  Filled 2016-01-17: qty 1

## 2016-01-17 MED ORDER — TETRACAINE HCL 0.5 % OP SOLN
1.0000 [drp] | Freq: Once | OPHTHALMIC | Status: AC
  Administered 2016-01-17: 1 [drp] via OPHTHALMIC
  Filled 2016-01-17: qty 2

## 2016-01-17 MED ORDER — IBUPROFEN 800 MG PO TABS
800.0000 mg | ORAL_TABLET | Freq: Once | ORAL | Status: AC
  Administered 2016-01-17: 800 mg via ORAL
  Filled 2016-01-17: qty 1

## 2016-01-17 MED ORDER — TRAMADOL HCL 50 MG PO TABS: 50.0000 mg | ORAL_TABLET | Freq: Four times a day (QID) | ORAL | Status: DC | PRN

## 2016-01-17 MED ORDER — FLUORESCEIN SODIUM 1 MG OP STRP
1.0000 | ORAL_STRIP | Freq: Once | OPHTHALMIC | Status: AC
  Administered 2016-01-17: 1 via OPHTHALMIC
  Filled 2016-01-17 (×2): qty 1

## 2016-01-17 MED ORDER — IBUPROFEN 800 MG PO TABS: 800.0000 mg | ORAL_TABLET | Freq: Three times a day (TID) | ORAL | Status: DC

## 2016-01-17 NOTE — Discharge Instructions (Signed)

## 2016-01-17 NOTE — ED Notes (Signed)
The pt refuses to, open his eyes he reports that he cannot open his eyes
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
## 2016-01-17 NOTE — ED Notes (Signed)
To ct

## 2016-01-18 ENCOUNTER — Encounter (HOSPITAL_COMMUNITY): Payer: Self-pay | Admitting: Emergency Medicine

## 2016-01-18 ENCOUNTER — Emergency Department (INDEPENDENT_AMBULATORY_CARE_PROVIDER_SITE_OTHER)
Admission: EM | Admit: 2016-01-18 | Discharge: 2016-01-18 | Disposition: A | Discharge status: Home / Self Care | Payer: Self-pay | Source: Home / Self Care | Attending: Family Medicine | Admitting: Family Medicine

## 2016-01-18 DIAGNOSIS — S0511XA Contusion of eyeball and orbital tissues, right eye, initial encounter: Secondary | ICD-10-CM
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
## 2016-01-18 NOTE — Discharge Instructions (Signed)
Continue your care as before, return if needed.
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
## 2016-12-14 ENCOUNTER — Emergency Department (HOSPITAL_COMMUNITY)
Admission: EM | Admit: 2016-12-14 | Discharge: 2016-12-14 | Disposition: A | Discharge status: Home / Self Care | Payer: BLUE CROSS/BLUE SHIELD | Attending: Emergency Medicine | Admitting: Emergency Medicine

## 2016-12-14 ENCOUNTER — Encounter (HOSPITAL_COMMUNITY): Payer: Self-pay | Admitting: Emergency Medicine

## 2016-12-14 DIAGNOSIS — K0889 Other specified disorders of teeth and supporting structures: Secondary | ICD-10-CM

## 2016-12-14 DIAGNOSIS — Z79899 Other long term (current) drug therapy: Secondary | ICD-10-CM | POA: Insufficient documentation

## 2016-12-14 DIAGNOSIS — Z7982 Long term (current) use of aspirin: Secondary | ICD-10-CM | POA: Insufficient documentation

## 2016-12-14 MED ORDER — OXYCODONE-ACETAMINOPHEN 5-325 MG PO TABS
ORAL_TABLET | ORAL | Status: AC
  Filled 2016-12-14: qty 1

## 2016-12-14 MED ORDER — IBUPROFEN 800 MG PO TABS: 800.0000 mg | ORAL_TABLET | Freq: Three times a day (TID) | ORAL | 0 refills | Status: DC

## 2016-12-14 MED ORDER — OXYCODONE-ACETAMINOPHEN 5-325 MG PO TABS
1.0000 | ORAL_TABLET | ORAL | Status: DC | PRN
Start: 2016-12-14 — End: 2016-12-14
  Administered 2016-12-14: 1 via ORAL
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
## 2016-12-14 NOTE — ED Triage Notes (Signed)
Left lower dental pain. No other complaints. States attempted to see dentist but was told that it would take him a week or more to get seen.

## 2016-12-14 NOTE — Discharge Instructions (Signed)
1. Medications: ibuprofen, usual home medications 2. Treatment: rest, drink plenty of fluids, take medications as prescribed 3. Follow Up: Please followup with dentistry within 1 week for discussion of your diagnoses and further evaluation after today's visit; if you do not have a primary care doctor use the resource guide provided to find one; Return to the ER for high fevers, difficulty breathing, difficulty swallowing or other concerning symptoms  

## 2017-09-29 ENCOUNTER — Emergency Department: Payer: Worker's Compensation

## 2017-09-29 ENCOUNTER — Encounter: Payer: Self-pay | Admitting: Emergency Medicine

## 2017-09-29 ENCOUNTER — Other Ambulatory Visit: Payer: Self-pay

## 2017-09-29 ENCOUNTER — Emergency Department
Admission: EM | Admit: 2017-09-29 | Discharge: 2017-09-29 | Disposition: A | Discharge status: Home / Self Care | Payer: Worker's Compensation | Attending: Emergency Medicine | Admitting: Emergency Medicine

## 2017-09-29 DIAGNOSIS — Y990 Civilian activity done for income or pay: Secondary | ICD-10-CM | POA: Insufficient documentation

## 2017-09-29 DIAGNOSIS — Y939 Activity, unspecified: Secondary | ICD-10-CM | POA: Insufficient documentation

## 2017-09-29 DIAGNOSIS — S99922A Unspecified injury of left foot, initial encounter: Secondary | ICD-10-CM | POA: Diagnosis present

## 2017-09-29 DIAGNOSIS — Y929 Unspecified place or not applicable: Secondary | ICD-10-CM | POA: Insufficient documentation

## 2017-09-29 DIAGNOSIS — W208XXA Other cause of strike by thrown, projected or falling object, initial encounter: Secondary | ICD-10-CM | POA: Diagnosis not present

## 2017-09-29 DIAGNOSIS — S9032XA Contusion of left foot, initial encounter: Secondary | ICD-10-CM | POA: Insufficient documentation

## 2017-09-29 DIAGNOSIS — Z79899 Other long term (current) drug therapy: Secondary | ICD-10-CM | POA: Diagnosis not present

## 2017-09-29 MED ORDER — KETOROLAC TROMETHAMINE 10 MG PO TABS
10.0000 mg | ORAL_TABLET | Freq: Once | ORAL | Status: AC
  Administered 2017-09-29: 10 mg via ORAL
  Filled 2017-09-29: qty 1

## 2017-09-29 MED ORDER — IBUPROFEN 800 MG PO TABS: 800.0000 mg | ORAL_TABLET | Freq: Three times a day (TID) | ORAL | 0 refills | Status: DC | PRN
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
## 2017-09-29 NOTE — ED Triage Notes (Signed)
Pt to triage via w/c with no distress noted; pt reports tangled up in a rack at work, falling backwards, pallet fell on left ankle; c/o pain since; pt employeed with [employer] (no profile in system); pt accomp by supervisor who st no drug testing required [place](Bobbie Perry)

## 2017-09-29 NOTE — ED Notes (Signed)
Patient transported to X-ray at this time 

## 2018-06-08 IMAGING — CR DG ANKLE COMPLETE 3+V*L*
3 series · 3 of 3 positions shown · non-contrast
Comparison: None.

CLINICAL DATA: Injury

EXAM:
LEFT ANKLE COMPLETE - 3+ VIEW

[ankle ap]
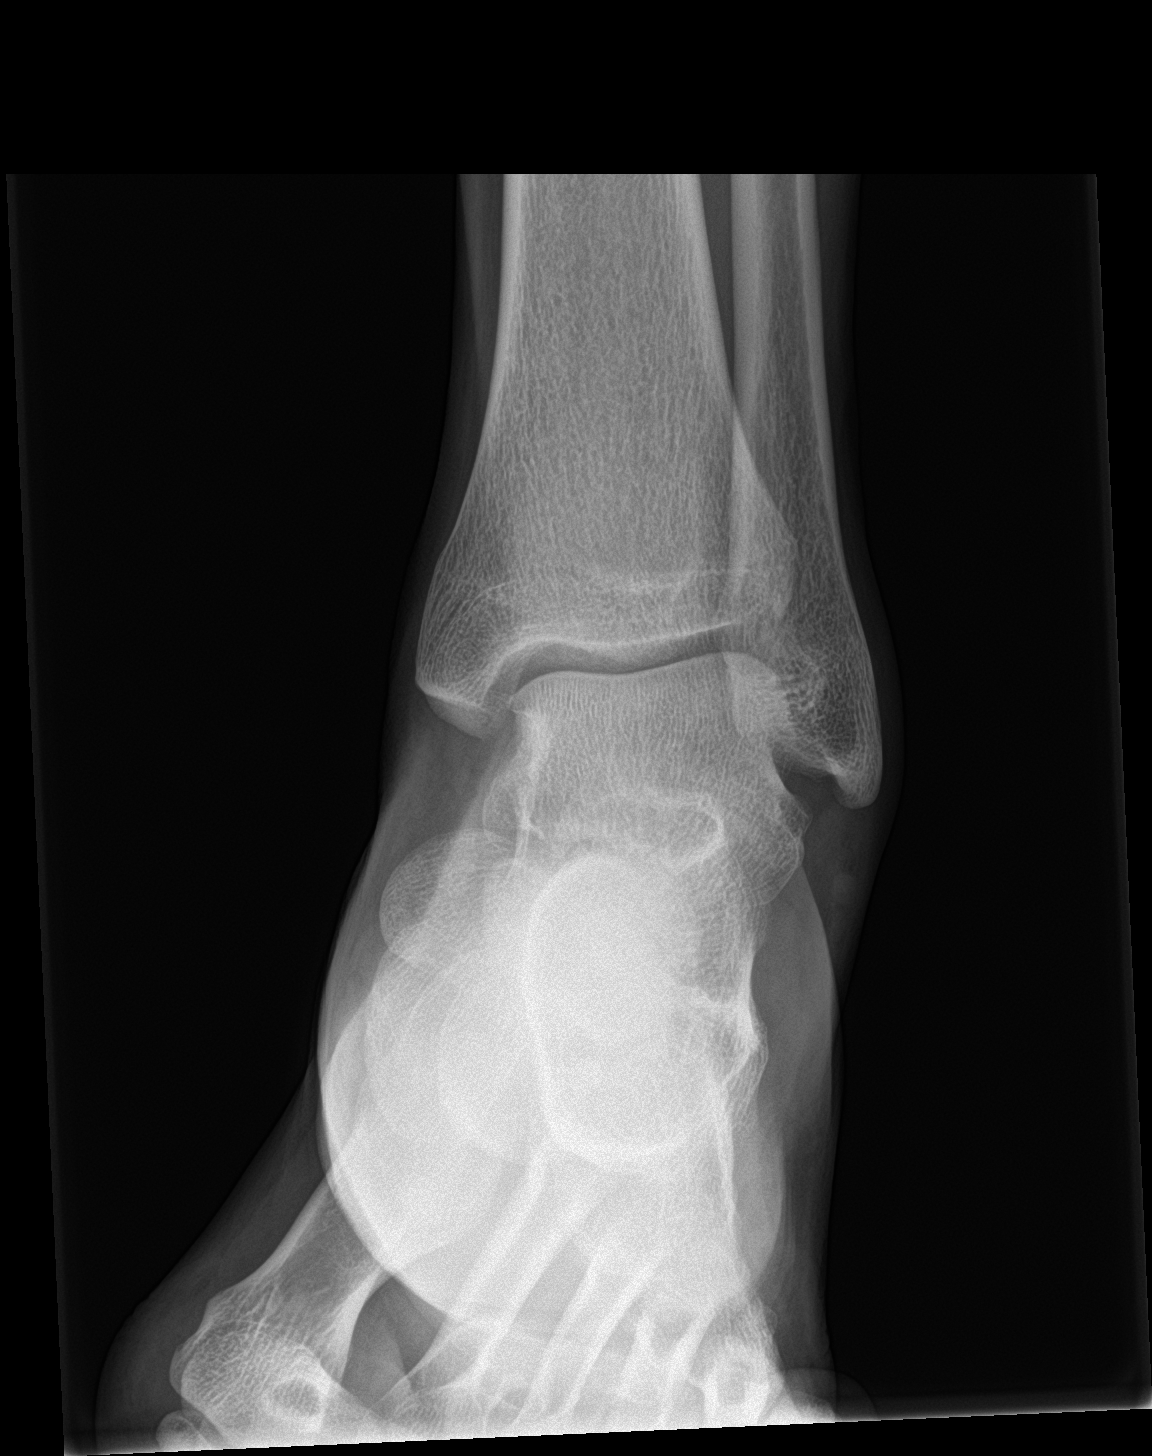

[ankle obl]
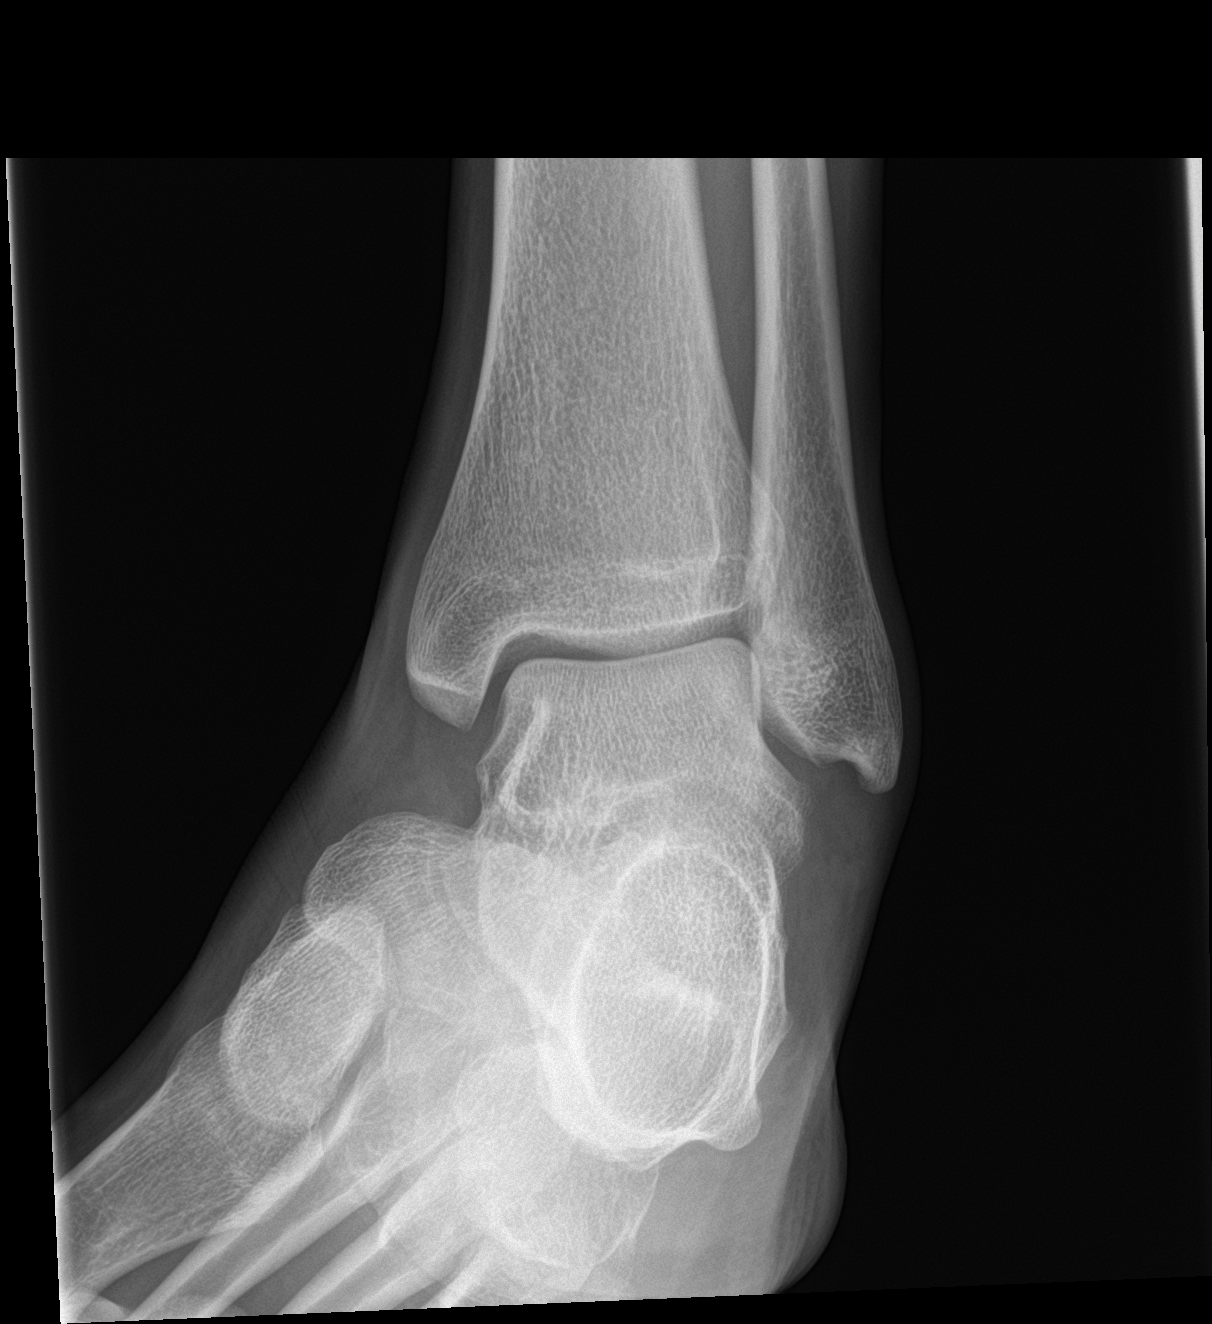

[ankle lat]
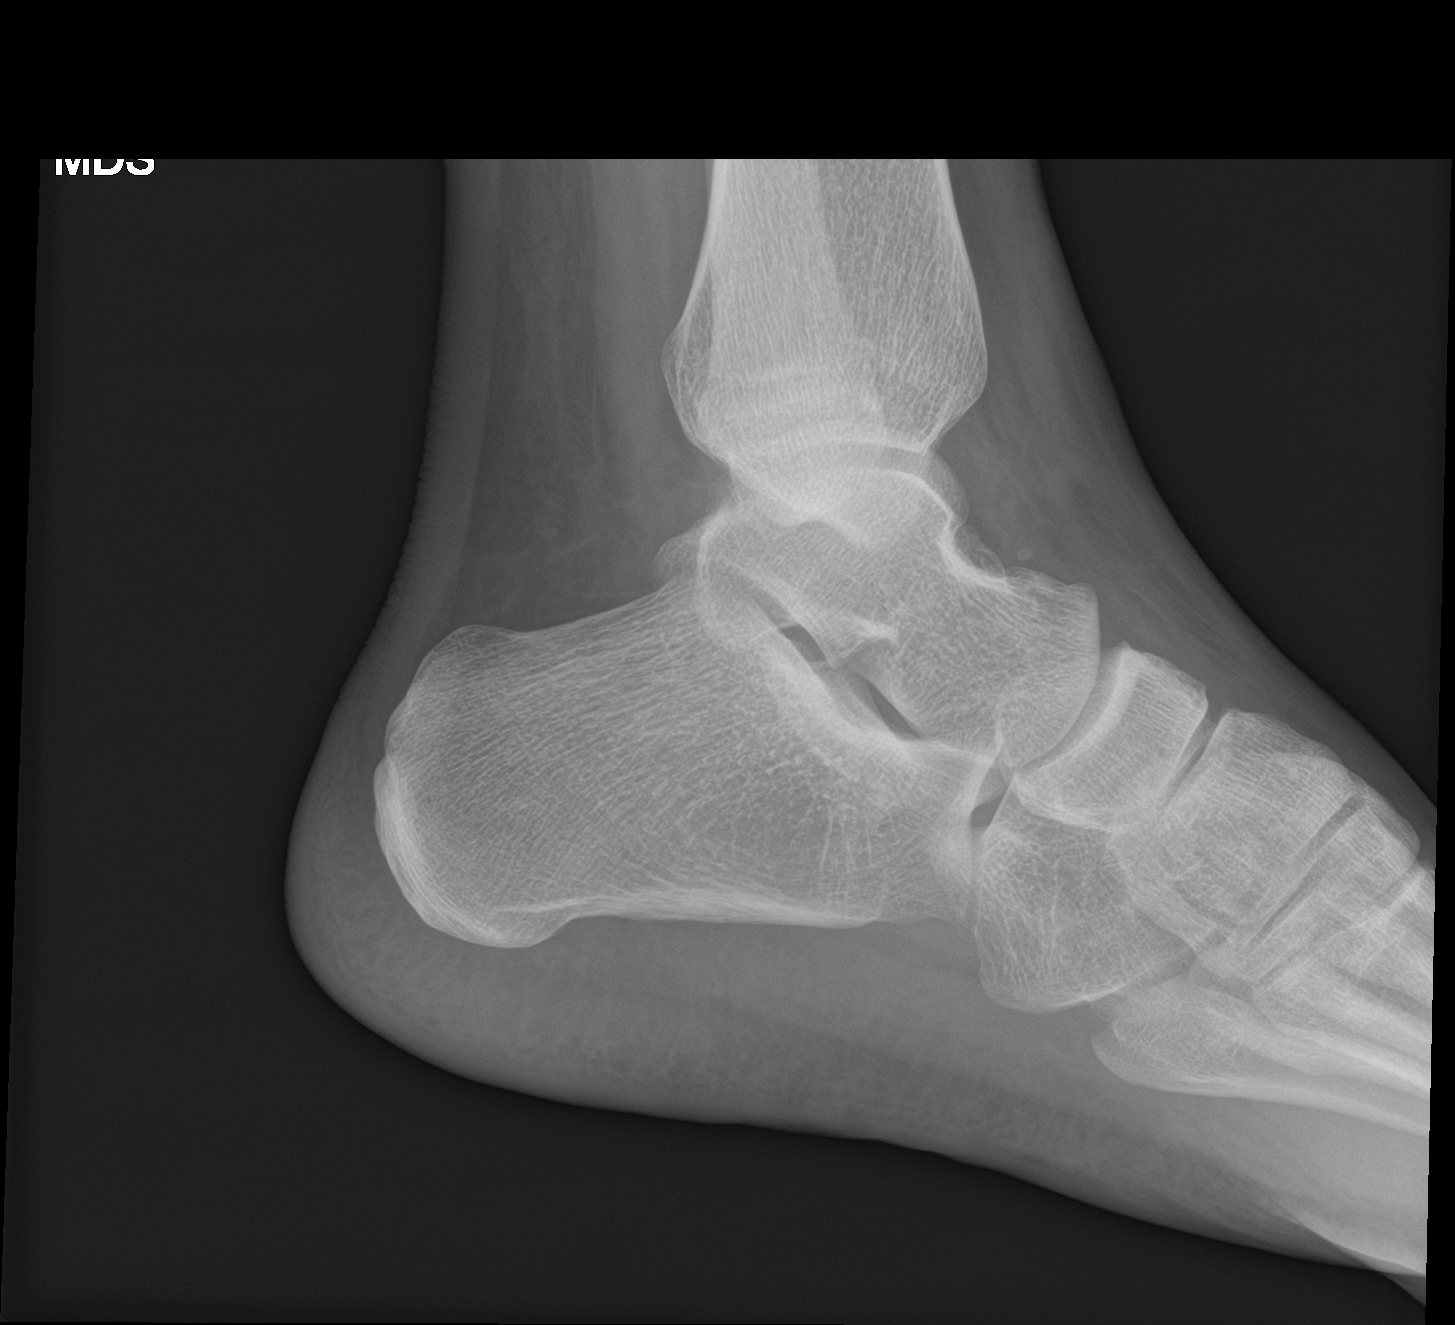

[3 of 3 positions shown; findings below may reference images not displayed]

FINDINGS: There is no evidence of fracture, dislocation, or joint effusion.
There is no evidence of arthropathy or other focal bone abnormality.
Soft tissues are unremarkable.
IMPRESSION: Negative.

## 2018-06-08 IMAGING — DX DG FOOT COMPLETE 3+V*L*
3 series · 3 of 3 positions shown · non-contrast
Comparison: LEFT ankle radiograph September 29, 2017 at 7677 hours

CLINICAL DATA: Lateral foot pain after fall at work.

EXAM:
LEFT FOOT - COMPLETE 3+ VIEW

[foot ap]
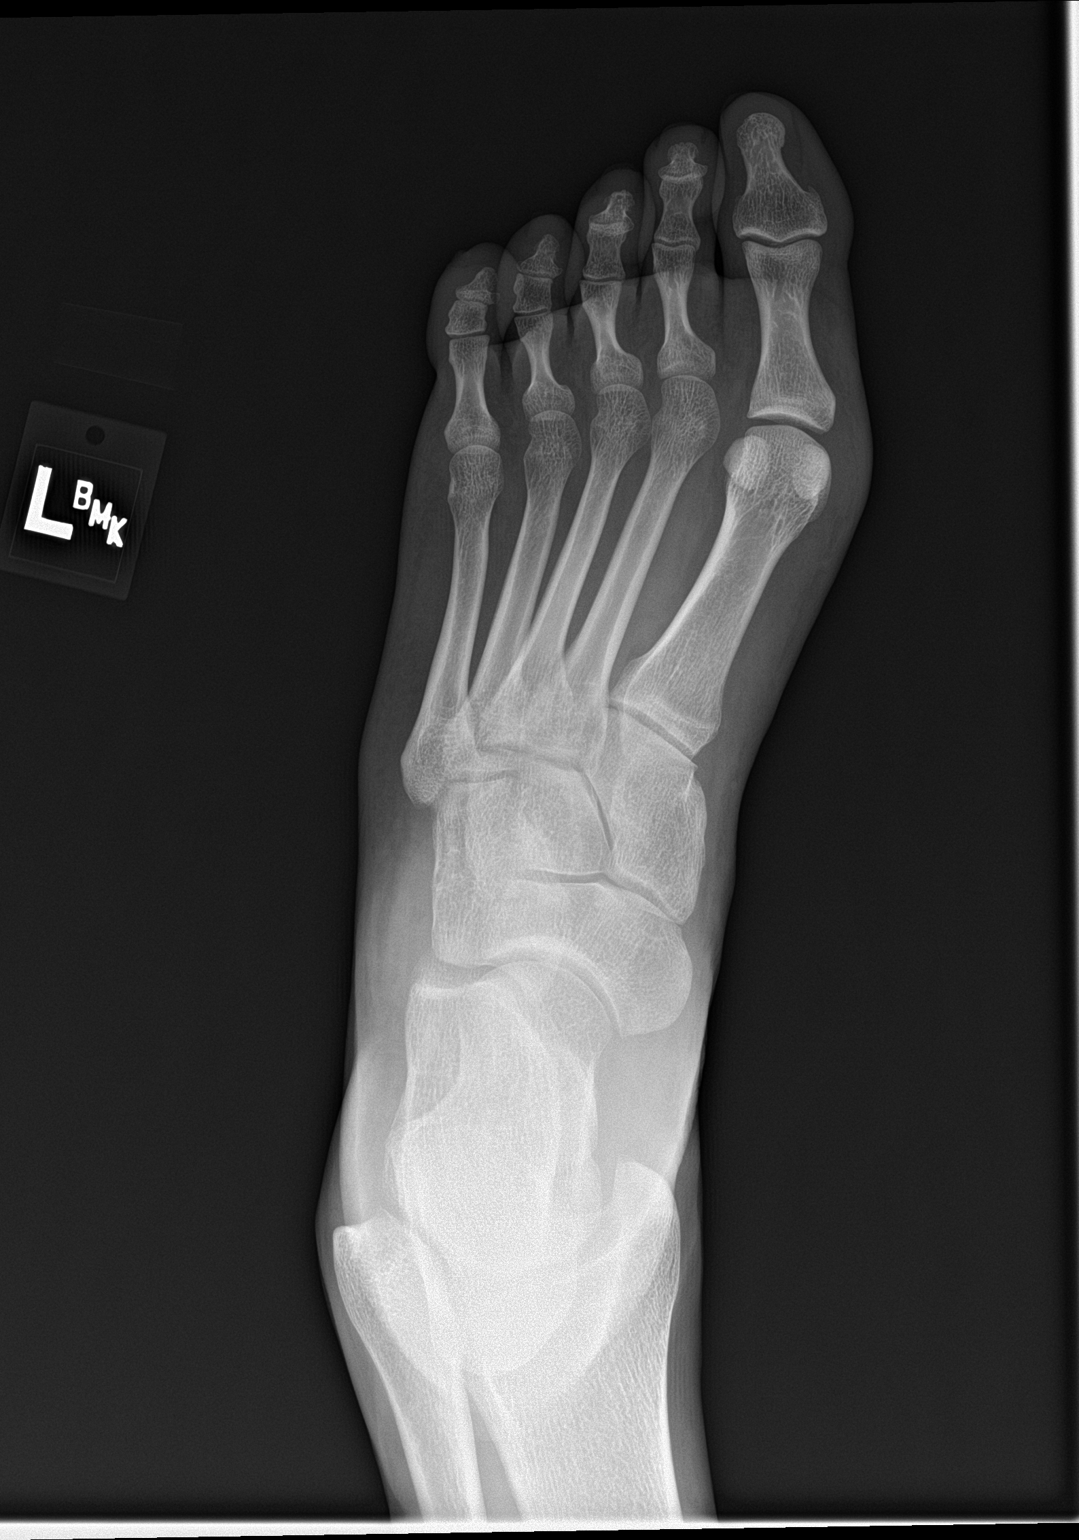

[foot obl]
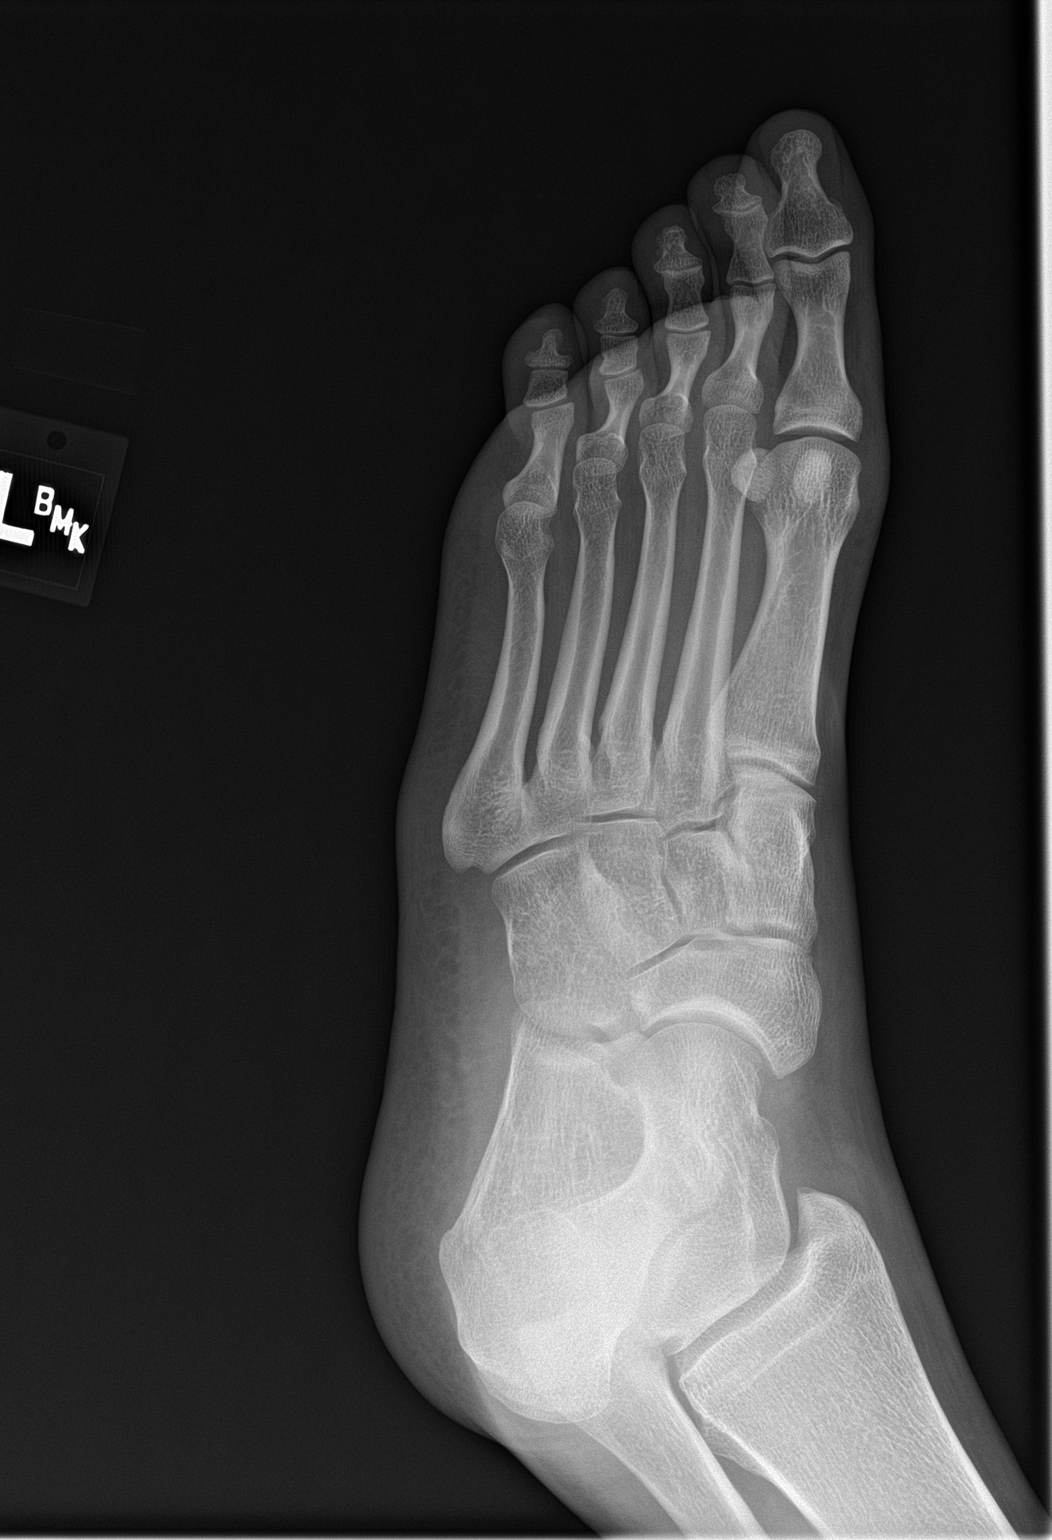

[foot lat]
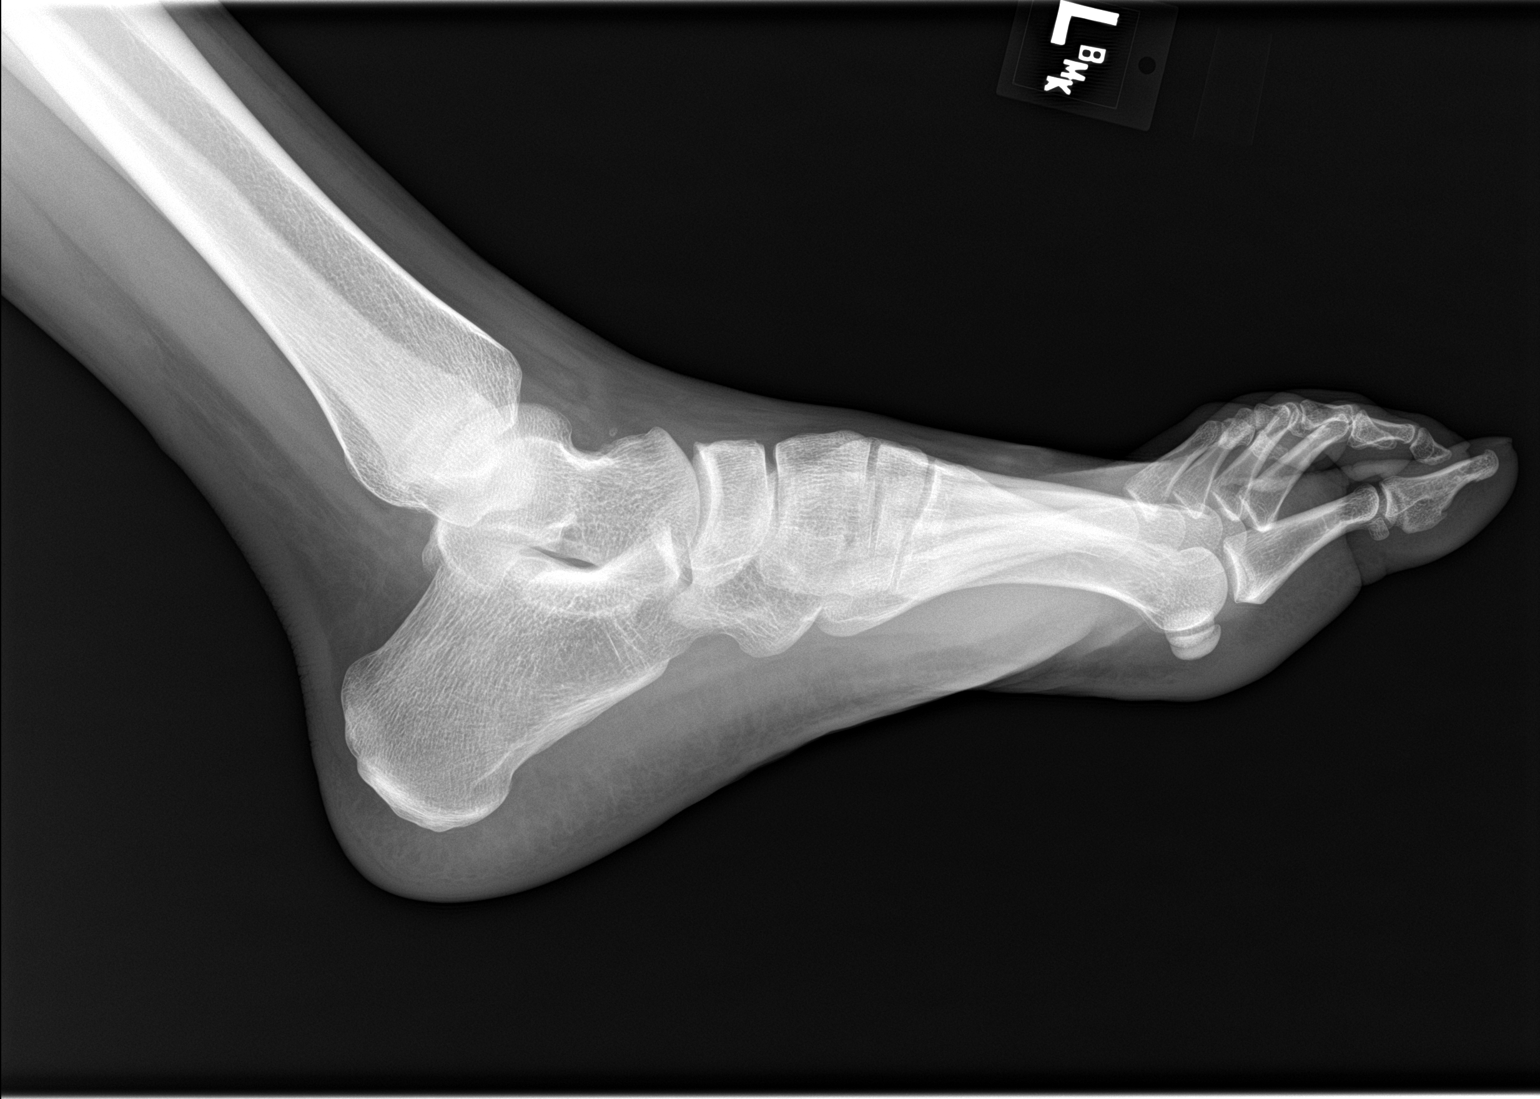

[3 of 3 positions shown; findings below may reference images not displayed]

FINDINGS: There is no evidence of fracture or dislocation. There is no
evidence of arthropathy or other focal bone abnormality. Soft
tissues are unremarkable.
IMPRESSION: Negative.

## 2019-03-04 ENCOUNTER — Ambulatory Visit (HOSPITAL_COMMUNITY)
Admission: EM | Admit: 2019-03-04 | Discharge: 2019-03-04 | Disposition: A | Discharge status: Home / Self Care | Payer: BLUE CROSS/BLUE SHIELD | Attending: Internal Medicine | Admitting: Internal Medicine

## 2019-03-04 ENCOUNTER — Other Ambulatory Visit: Payer: Self-pay

## 2019-03-04 ENCOUNTER — Encounter (HOSPITAL_COMMUNITY): Payer: Self-pay

## 2019-03-04 DIAGNOSIS — L738 Other specified follicular disorders: Secondary | ICD-10-CM | POA: Diagnosis not present

## 2019-03-04 MED ORDER — CEPHALEXIN 500 MG PO CAPS: 500.0000 mg | ORAL_CAPSULE | Freq: Three times a day (TID) | ORAL | 0 refills | Status: AC
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
## 2019-03-04 NOTE — ED Notes (Signed)
Patient verbalizes understanding of discharge instructions. Opportunity for questioning and answers were provided. Patient discharged from [facility] by MD. 

## 2019-06-08 ENCOUNTER — Ambulatory Visit (HOSPITAL_COMMUNITY)
Admission: EM | Admit: 2019-06-08 | Discharge: 2019-06-08 | Disposition: A | Discharge status: Home / Self Care | Payer: BC Managed Care – PPO | Attending: Family Medicine | Admitting: Family Medicine

## 2019-06-08 ENCOUNTER — Encounter (HOSPITAL_COMMUNITY): Payer: Self-pay

## 2019-06-08 ENCOUNTER — Other Ambulatory Visit: Payer: Self-pay

## 2019-06-08 DIAGNOSIS — Z202 Contact with and (suspected) exposure to infections with a predominantly sexual mode of transmission: Secondary | ICD-10-CM | POA: Diagnosis present

## 2019-06-08 MED ORDER — AZITHROMYCIN 250 MG PO TABS
ORAL_TABLET | ORAL | Status: AC
  Filled 2019-06-08: qty 4

## 2019-06-08 MED ORDER — AZITHROMYCIN 250 MG PO TABS
1000.0000 mg | ORAL_TABLET | Freq: Once | ORAL | Status: AC
  Administered 2019-06-08: 1000 mg via ORAL

## 2019-06-08 NOTE — Discharge Instructions (Signed)
Will notify you of any positive findings and if any changes to treatment are needed.   °Please withhold from intercourse for the next week. °Please use condoms to prevent STD's.   °
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
## 2019-06-08 NOTE — ED Triage Notes (Signed)
Patient presents to Urgent Care with complaints of needing STD testing since his partner just told him she had chlamydia. Patient reports he has no symptoms.

## 2019-06-11 ENCOUNTER — Telehealth (HOSPITAL_COMMUNITY): Payer: Self-pay | Admitting: Emergency Medicine

## 2019-06-11 LAB — URINE CYTOLOGY ANCILLARY ONLY
Chlamydia: POSITIVE — AB
Neisseria Gonorrhea: NEGATIVE

## 2019-06-11 NOTE — Telephone Encounter (Signed)
Chlamydia is positive.  This was treated at the urgent care visit with po zithromax 1g.  Pt needs education to please refrain from sexual intercourse for 7 days to give the medicine time to work.  Sexual partners need to be notified and tested/treated.  Condoms may reduce risk of reinfection.  Recheck or followup with PCP for further evaluation if symptoms are not improving.  [facility] notified.  Attempted to reach patient. No answer at this time. Call cannot be completed.

## 2019-06-12 ENCOUNTER — Telehealth (HOSPITAL_COMMUNITY): Payer: Self-pay | Admitting: Emergency Medicine

## 2019-06-12 NOTE — Telephone Encounter (Signed)
Attempted to reach patient x2. No answer at this time. Call cannot be completed.   

## 2019-06-14 ENCOUNTER — Telehealth (HOSPITAL_COMMUNITY): Payer: Self-pay | Admitting: Emergency Medicine

## 2019-06-14 NOTE — Telephone Encounter (Signed)
Attempted to reach patient x3. No answer at this time. Call cannot be completed. . Letter sent    

## 2020-04-02 ENCOUNTER — Ambulatory Visit (HOSPITAL_COMMUNITY)
Admission: EM | Admit: 2020-04-02 | Discharge: 2020-04-02 | Disposition: A | Discharge status: Home / Self Care | Payer: PRIVATE HEALTH INSURANCE | Attending: Internal Medicine | Admitting: Internal Medicine

## 2020-04-02 ENCOUNTER — Other Ambulatory Visit: Payer: Self-pay

## 2020-04-02 DIAGNOSIS — L723 Sebaceous cyst: Secondary | ICD-10-CM

## 2020-04-02 NOTE — ED Triage Notes (Signed)
Pt c/o swollen area to right side of neck x 3 weeks, believes to be an ingrown hair
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
## 2020-05-04 ENCOUNTER — Encounter (HOSPITAL_COMMUNITY): Payer: Self-pay

## 2020-05-04 ENCOUNTER — Emergency Department (HOSPITAL_COMMUNITY)
Admission: EM | Admit: 2020-05-04 | Discharge: 2020-05-04 | Disposition: A | Discharge status: Home / Self Care | Payer: No Typology Code available for payment source | Attending: Emergency Medicine | Admitting: Emergency Medicine

## 2020-05-04 DIAGNOSIS — K029 Dental caries, unspecified: Secondary | ICD-10-CM | POA: Insufficient documentation

## 2020-05-04 DIAGNOSIS — K0889 Other specified disorders of teeth and supporting structures: Secondary | ICD-10-CM | POA: Diagnosis not present

## 2020-05-04 MED ORDER — PENICILLIN V POTASSIUM 500 MG PO TABS: 1000.0000 mg | ORAL_TABLET | Freq: Two times a day (BID) | ORAL | 0 refills | Status: DC

## 2020-05-04 MED ORDER — ACETAMINOPHEN 500 MG PO TABS
1000.0000 mg | ORAL_TABLET | Freq: Once | ORAL | Status: AC
  Administered 2020-05-04: 1000 mg via ORAL
  Filled 2020-05-04: qty 2

## 2020-05-04 MED ORDER — BUPIVACAINE-EPINEPHRINE (PF) 0.5% -1:200000 IJ SOLN
1.8000 mL | Freq: Once | INTRAMUSCULAR | Status: AC
  Administered 2020-05-04: 1.8 mL
  Filled 2020-05-04: qty 1.8

## 2020-05-04 MED ORDER — KETOROLAC TROMETHAMINE 60 MG/2ML IM SOLN
15.0000 mg | Freq: Once | INTRAMUSCULAR | Status: AC
  Administered 2020-05-04: 15 mg via INTRAMUSCULAR
  Filled 2020-05-04: qty 2

## 2020-05-04 NOTE — ED Triage Notes (Signed)
Pt c.o right lower tooth pain for the past 2 weeks, states the tooth broke off. Cant get into his dentist for 2 weeks. No swelling noted
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
## 2020-05-04 NOTE — ED Notes (Signed)
Pt verbalized understanding of discharge instructions, ambulated out of ED in NAD

## 2020-05-04 NOTE — Discharge Instructions (Signed)
Take 4 over the counter ibuprofen tablets 3 times a day or 2 over-the-counter naproxen tablets twice a day for pain. Also take tylenol 1000mg (2 extra strength) four times a day.   Return for facial swelling or difficulty breathing.

## 2020-12-04 ENCOUNTER — Ambulatory Visit (INDEPENDENT_AMBULATORY_CARE_PROVIDER_SITE_OTHER): Payer: BC Managed Care – PPO | Admitting: Podiatry

## 2020-12-04 ENCOUNTER — Encounter: Payer: Self-pay | Admitting: Podiatry

## 2020-12-04 ENCOUNTER — Other Ambulatory Visit: Payer: Self-pay

## 2020-12-04 DIAGNOSIS — S99919S Unspecified injury of unspecified ankle, sequela: Secondary | ICD-10-CM | POA: Diagnosis not present

## 2020-12-04 DIAGNOSIS — M79671 Pain in right foot: Secondary | ICD-10-CM

## 2020-12-04 DIAGNOSIS — L84 Corns and callosities: Secondary | ICD-10-CM

## 2020-12-04 DIAGNOSIS — M7752 Other enthesopathy of left foot: Secondary | ICD-10-CM

## 2020-12-04 DIAGNOSIS — S99929S Unspecified injury of unspecified foot, sequela: Secondary | ICD-10-CM

## 2020-12-04 DIAGNOSIS — M79672 Pain in left foot: Secondary | ICD-10-CM | POA: Diagnosis not present

## 2020-12-04 DIAGNOSIS — M7751 Other enthesopathy of right foot: Secondary | ICD-10-CM
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
## 2020-12-15 ENCOUNTER — Encounter (HOSPITAL_COMMUNITY): Payer: Self-pay | Admitting: Emergency Medicine

## 2020-12-15 ENCOUNTER — Ambulatory Visit (HOSPITAL_COMMUNITY)
Admission: EM | Admit: 2020-12-15 | Discharge: 2020-12-15 | Disposition: A | Discharge status: Home / Self Care | Payer: BC Managed Care – PPO | Attending: Urgent Care | Admitting: Urgent Care

## 2020-12-15 ENCOUNTER — Other Ambulatory Visit: Payer: Self-pay

## 2020-12-15 DIAGNOSIS — R197 Diarrhea, unspecified: Secondary | ICD-10-CM | POA: Insufficient documentation

## 2020-12-15 DIAGNOSIS — Z20822 Contact with and (suspected) exposure to covid-19: Secondary | ICD-10-CM | POA: Diagnosis present

## 2020-12-15 DIAGNOSIS — B349 Viral infection, unspecified: Secondary | ICD-10-CM | POA: Diagnosis present

## 2020-12-15 MED ORDER — LOPERAMIDE HCL 2 MG PO CAPS: 2.0000 mg | ORAL_CAPSULE | Freq: Two times a day (BID) | ORAL | 0 refills | Status: DC | PRN

## 2020-12-15 NOTE — ED Triage Notes (Signed)
Pt presents for COVID test after exposure 2 days ago. States has had diarrhea that started yesterday.

## 2020-12-15 NOTE — Discharge Instructions (Signed)
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
## 2020-12-16 LAB — SARS CORONAVIRUS 2 (TAT 6-24 HRS): SARS Coronavirus 2: NEGATIVE

## 2020-12-29 ENCOUNTER — Other Ambulatory Visit: Payer: BC Managed Care – PPO

## 2021-03-19 ENCOUNTER — Ambulatory Visit: Payer: BC Managed Care – PPO | Admitting: Podiatry

## 2021-10-05 ENCOUNTER — Encounter (HOSPITAL_COMMUNITY): Payer: Self-pay | Admitting: Emergency Medicine

## 2021-10-05 ENCOUNTER — Ambulatory Visit (HOSPITAL_COMMUNITY)
Admission: EM | Admit: 2021-10-05 | Discharge: 2021-10-05 | Disposition: A | Discharge status: Home / Self Care | Payer: Self-pay | Attending: Urgent Care | Admitting: Urgent Care

## 2021-10-05 ENCOUNTER — Other Ambulatory Visit: Payer: Self-pay

## 2021-10-05 DIAGNOSIS — Z202 Contact with and (suspected) exposure to infections with a predominantly sexual mode of transmission: Secondary | ICD-10-CM

## 2021-10-05 DIAGNOSIS — Z7251 High risk heterosexual behavior: Secondary | ICD-10-CM | POA: Insufficient documentation

## 2021-10-05 DIAGNOSIS — Z113 Encounter for screening for infections with a predominantly sexual mode of transmission: Secondary | ICD-10-CM | POA: Insufficient documentation

## 2021-10-05 LAB — RPR: RPR Ser Ql: NONREACTIVE

## 2021-10-05 LAB — HIV ANTIBODY (ROUTINE TESTING W REFLEX): HIV Screen 4th Generation wRfx: NONREACTIVE

## 2021-10-05 NOTE — ED Triage Notes (Signed)
Pt reports found out girlfriend cheated on him so wants std testing. To make sure doenst have anything. Denies any s/s of STDs.

## 2021-10-05 NOTE — Discharge Instructions (Signed)
Avoid all forms of sexual intercourse (oral, vaginal, anal) for the next 7 days to avoid spreading/reinfecting or at least until we can see what kinds of infection results are positive. Return if symptoms worsen/do not resolve, you develop fever, abdominal pain, blood in your urine, or are re-exposed to a sexually transmitted infection (STI). We will let you know if you need any kind of treatments for positive results.
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
## 2021-10-06 LAB — CYTOLOGY, (ORAL, ANAL, URETHRAL) ANCILLARY ONLY
Chlamydia: NEGATIVE
Comment: NEGATIVE
Comment: NEGATIVE
Comment: NORMAL
Neisseria Gonorrhea: NEGATIVE
Trichomonas: NEGATIVE

## 2022-01-07 ENCOUNTER — Other Ambulatory Visit: Payer: Self-pay

## 2022-01-07 ENCOUNTER — Encounter: Payer: Self-pay | Admitting: Podiatry

## 2022-01-07 ENCOUNTER — Ambulatory Visit (INDEPENDENT_AMBULATORY_CARE_PROVIDER_SITE_OTHER): Payer: BC Managed Care – PPO | Admitting: Podiatry

## 2022-01-07 DIAGNOSIS — L84 Corns and callosities: Secondary | ICD-10-CM | POA: Diagnosis not present

## 2022-01-07 DIAGNOSIS — M79672 Pain in left foot: Secondary | ICD-10-CM

## 2022-01-07 DIAGNOSIS — M79671 Pain in right foot: Secondary | ICD-10-CM

## 2022-01-07 NOTE — Progress Notes (Signed)
This patient presents to the office for painful callus on both feet.  He says the callus are painful walking and wearing her shoes. He was treated by Dr.  Donzetta Matters who debrided the calluses  about 13 months ago.  He presents for treatment of the callus  B/L. ? ?General Appearance  Alert, conversant and in no acute stress. ? ?Vascular  Dorsalis pedis and posterior tibial  pulses are palpable  bilaterally.  Capillary return is within normal limits  bilaterally. Temperature is within normal limits  bilaterally. ? ?Neurologic  Senn-Weinstein monofilament wire test within normal limits  bilaterally. Muscle power within normal limits bilaterally. ? ?Nails Thick disfigured discolored nails with subungual debris  from hallux to fifth toes bilaterally. No evidence of bacterial infection or drainage bilaterally. ? ?Orthopedic  No limitations of motion  feet .  No crepitus or effusions noted.  No bony pathology or digital deformities noted. ? ?Skin  normotropic skin with no porokeratosis noted bilaterally.  No signs of infections or ulcers noted.   Callus sub 2  B/L, sub 5th metabase right foot and HD 5th  B/L. ? ?Callus  B/L ? ?Debride callus with # 15 blade.   Refer to Dr.  Allena Katz for surgical consult.  RTC prn ? ?Helane Gunther DPM  ?

## 2022-01-26 ENCOUNTER — Ambulatory Visit: Payer: BC Managed Care – PPO | Admitting: Podiatry

## 2022-02-16 ENCOUNTER — Encounter: Payer: Self-pay | Admitting: Podiatry

## 2022-02-16 ENCOUNTER — Ambulatory Visit (INDEPENDENT_AMBULATORY_CARE_PROVIDER_SITE_OTHER): Payer: BC Managed Care – PPO | Admitting: Podiatry

## 2022-02-16 ENCOUNTER — Ambulatory Visit (INDEPENDENT_AMBULATORY_CARE_PROVIDER_SITE_OTHER): Payer: BC Managed Care – PPO

## 2022-02-16 DIAGNOSIS — Q666 Other congenital valgus deformities of feet: Secondary | ICD-10-CM | POA: Diagnosis not present

## 2022-02-16 DIAGNOSIS — S99919S Unspecified injury of unspecified ankle, sequela: Secondary | ICD-10-CM

## 2022-02-16 DIAGNOSIS — M7752 Other enthesopathy of left foot: Secondary | ICD-10-CM | POA: Diagnosis not present

## 2022-02-16 DIAGNOSIS — M7751 Other enthesopathy of right foot: Secondary | ICD-10-CM

## 2022-02-16 DIAGNOSIS — Q828 Other specified congenital malformations of skin: Secondary | ICD-10-CM

## 2022-02-16 DIAGNOSIS — L84 Corns and callosities: Secondary | ICD-10-CM

## 2022-02-16 NOTE — Progress Notes (Signed)
SITUATION ?Reason for Consult: Evaluation for Bilateral Custom Foot Orthoses ?Patient / Caregiver Report: Patient is ready for foot orthotics ? ?OBJECTIVE DATA: ?Patient History / Diagnosis:  ?  ICD-10-CM   ?1. Capsulitis of metatarsophalangeal (MTP) joints of both feet  M77.51   ? M77.52   ?  ?2. Multiple injuries of ankle and foot, unspecified laterality, sequela  S99.919S   ? S99.929S   ?  ?3. Callus  L84   ?  ? ? ?Current or Previous Devices:   None and no history ? ?Foot Examination: ?Skin presentation:   compromised ?Ulcers & Callousing:   1st, 3rd met head, 5th base bilateral ?Toe / Foot Deformities:  Pes cavus, hammertoes ?Weight Bearing Presentation:  Cavus ?Sensation:    Intact ? ?Shoe Size:    10.4M ? ?ORTHOTIC RECOMMENDATION ?Recommended Device: 1x pair of custom functional foot orthotics ? ?GOALS OF ORTHOSES ?- Reduce Pain ?- Prevent Foot Deformity ?- Prevent Progression of Further Foot Deformity ?- Relieve Pressure ?- Improve the Overall Biomechanical Function of the Foot and Lower Extremity. ? ?ACTIONS PERFORMED ?Potential out of pocket cost was communicated to patient. Patient understood and consent to casting. Patient was casted for Foot Orthoses via crush box. Procedure was explained and patient tolerated procedure well. Casts were shipped to central fabrication. All questions were answered and concerns addressed. ? ?PLAN ?Patient is to be called for fitting when devices are ready.  ? ? ?
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
## 2022-03-09 ENCOUNTER — Telehealth: Payer: Self-pay

## 2022-03-09 NOTE — Telephone Encounter (Signed)
Left message on machine to call back to schedule appt to pick up orthotics

## 2022-03-09 NOTE — Telephone Encounter (Signed)
Foot orthotics ready, LVM for patient to schedule appointment ?

## 2022-03-11 ENCOUNTER — Ambulatory Visit: Payer: BC Managed Care – PPO

## 2022-03-11 DIAGNOSIS — M7751 Other enthesopathy of right foot: Secondary | ICD-10-CM

## 2022-03-11 DIAGNOSIS — S99919S Unspecified injury of unspecified ankle, sequela: Secondary | ICD-10-CM

## 2022-03-11 DIAGNOSIS — L84 Corns and callosities: Secondary | ICD-10-CM

## 2022-03-11 DIAGNOSIS — Q666 Other congenital valgus deformities of feet: Secondary | ICD-10-CM

## 2022-03-11 NOTE — Progress Notes (Signed)
SITUATION: ?Reason for Visit: Fitting and Delivery of Custom Fabricated Foot Orthoses ?Patient Report: Patient reports comfort and is satisfied with device. ? ?OBJECTIVE DATA: ?Patient History / Diagnosis:   ?  ICD-10-CM   ?1. Capsulitis of metatarsophalangeal (MTP) joints of both feet  M77.51   ? M77.52   ?  ?2. Multiple injuries of ankle and foot, unspecified laterality, sequela  S99.919S   ? S99.929S   ?  ?3. Callus  L84   ?  ?4. Pes planovalgus  Q66.6   ?  ? ? ?Provided Device:  Custom Functional Foot Orthotics ?    [employer]: [id number] ? ?GOAL OF ORTHOSIS ?- Improve gait ?- Decrease energy expenditure ?- Improve Balance ?- Provide Triplanar stability of foot complex ?- Facilitate motion ? ?ACTIONS PERFORMED ?Patient was fit with foot orthotics trimmed to shoe last. Patient tolerated fittign procedure.  ? ?Patient was provided with verbal and written instruction and demonstration regarding donning, doffing, wear, care, proper fit, function, purpose, cleaning, and use of the orthosis and in all related precautions and risks and benefits regarding the orthosis. ? ?Patient was also provided with verbal instruction regarding how to report any failures or malfunctions of the orthosis and necessary follow up care. Patient was also instructed to contact our office regarding any change in status that may affect the function of the orthosis. ? ?Patient demonstrated independence with proper donning, doffing, and fit and verbalized understanding of all instructions. ? ?PLAN: ?Patient is to follow up in one week or as necessary (PRN). All questions were answered and concerns addressed. Plan of care was discussed with and agreed upon by the patient. ? ?

## 2022-03-12 ENCOUNTER — Encounter (HOSPITAL_COMMUNITY): Payer: Self-pay | Admitting: Emergency Medicine

## 2022-03-12 ENCOUNTER — Ambulatory Visit (HOSPITAL_COMMUNITY)
Admission: EM | Admit: 2022-03-12 | Discharge: 2022-03-12 | Disposition: A | Discharge status: Home / Self Care | Payer: BC Managed Care – PPO | Attending: Emergency Medicine | Admitting: Emergency Medicine

## 2022-03-12 DIAGNOSIS — S61011A Laceration without foreign body of right thumb without damage to nail, initial encounter: Secondary | ICD-10-CM

## 2022-03-12 NOTE — ED Triage Notes (Signed)
Pt presents with a laceration on the right thumb. States he was taking out the trash and cut his finger on a can. Bleeding controlled. Last tetanus back in 2013. ?

## 2022-03-12 NOTE — Discharge Instructions (Signed)
4 sutures placed, return in 7 to 10 days for removal ? ?Please keep area dry for the next 24 hours, do not allow to become soaking wet ? ?May cleanse daily with normal hygiene using diluted soapy water, pat dry then cover with a Band-Aid if at risk for contamination ? ?May use Tylenol or ibuprofen every 6 hours as needed for discomfort ? ?Please watch for signs of infection such as increased swelling, increased pain, puslike drainage, fever or chills, if this occurs at any point please return for reevaluation of symptoms ?

## 2022-03-14 DIAGNOSIS — S61011A Laceration without foreign body of right thumb without damage to nail, initial encounter: Secondary | ICD-10-CM | POA: Diagnosis not present
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
## 2022-03-19 ENCOUNTER — Ambulatory Visit (HOSPITAL_COMMUNITY)
Admission: EM | Admit: 2022-03-19 | Discharge: 2022-03-19 | Disposition: A | Discharge status: Home / Self Care | Payer: BC Managed Care – PPO

## 2022-03-19 NOTE — ED Triage Notes (Signed)
Pt is present today for suture removal on the right thumb. Two of the suture pulled apart. Laceration looks healed and patient denies and pain or drainage.

## 2022-04-01 ENCOUNTER — Ambulatory Visit (INDEPENDENT_AMBULATORY_CARE_PROVIDER_SITE_OTHER): Payer: BC Managed Care – PPO | Admitting: Podiatry

## 2022-04-01 DIAGNOSIS — M7671 Peroneal tendinitis, right leg: Secondary | ICD-10-CM | POA: Diagnosis not present

## 2022-04-01 DIAGNOSIS — M898X9 Other specified disorders of bone, unspecified site: Secondary | ICD-10-CM | POA: Diagnosis not present
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
## 2022-04-15 ENCOUNTER — Ambulatory Visit (INDEPENDENT_AMBULATORY_CARE_PROVIDER_SITE_OTHER): Payer: BC Managed Care – PPO | Admitting: Podiatry

## 2022-04-15 ENCOUNTER — Encounter: Payer: Self-pay | Admitting: Podiatry

## 2022-04-15 DIAGNOSIS — M79671 Pain in right foot: Secondary | ICD-10-CM

## 2022-04-15 DIAGNOSIS — M79675 Pain in left toe(s): Secondary | ICD-10-CM

## 2022-04-15 DIAGNOSIS — M79672 Pain in left foot: Secondary | ICD-10-CM

## 2022-04-15 DIAGNOSIS — B351 Tinea unguium: Secondary | ICD-10-CM

## 2022-04-15 DIAGNOSIS — L84 Corns and callosities: Secondary | ICD-10-CM | POA: Diagnosis not present

## 2022-04-15 DIAGNOSIS — M79674 Pain in right toe(s): Secondary | ICD-10-CM

## 2022-04-15 DIAGNOSIS — Q828 Other specified congenital malformations of skin: Secondary | ICD-10-CM

## 2022-04-29 ENCOUNTER — Ambulatory Visit: Payer: BC Managed Care – PPO | Admitting: Podiatry

## 2022-05-12 ENCOUNTER — Ambulatory Visit (INDEPENDENT_AMBULATORY_CARE_PROVIDER_SITE_OTHER): Payer: BC Managed Care – PPO | Admitting: Podiatry

## 2022-05-12 DIAGNOSIS — M898X9 Other specified disorders of bone, unspecified site: Secondary | ICD-10-CM | POA: Diagnosis not present

## 2022-05-12 DIAGNOSIS — Z01818 Encounter for other preprocedural examination: Secondary | ICD-10-CM

## 2022-05-13 ENCOUNTER — Ambulatory Visit: Payer: BC Managed Care – PPO | Admitting: Podiatry

## 2022-05-18 DIAGNOSIS — M79676 Pain in unspecified toe(s): Secondary | ICD-10-CM
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
## 2022-05-24 ENCOUNTER — Telehealth: Payer: Self-pay | Admitting: Urology

## 2022-05-24 NOTE — Telephone Encounter (Signed)
DOS - 06/06/22  TARSAL EXOSTECTOMY 5TH RIGHT --- 00938  [employer] EFFECTIVE DATE - 12/01/21  PLAN DEDUCTIBLE - $1,500.00 W/ $0.00 REMAINING OUT OF POCKET - $3,000.00 W/ $1,399.71 REMAINING COINSURANCE - 20% COPAY - $0.00  NO PRIOR AUTH IS REQUIRED

## 2022-05-25 ENCOUNTER — Other Ambulatory Visit: Payer: Self-pay

## 2022-05-25 ENCOUNTER — Emergency Department (HOSPITAL_COMMUNITY): Payer: BC Managed Care – PPO

## 2022-05-25 ENCOUNTER — Encounter (HOSPITAL_COMMUNITY): Payer: Self-pay

## 2022-05-25 ENCOUNTER — Emergency Department (HOSPITAL_COMMUNITY)
Admission: EM | Admit: 2022-05-25 | Discharge: 2022-05-25 | Disposition: A | Discharge status: Home / Self Care | Payer: BC Managed Care – PPO | Attending: Emergency Medicine | Admitting: Emergency Medicine

## 2022-05-25 DIAGNOSIS — K0889 Other specified disorders of teeth and supporting structures: Secondary | ICD-10-CM | POA: Diagnosis present

## 2022-05-25 DIAGNOSIS — R002 Palpitations: Secondary | ICD-10-CM | POA: Diagnosis not present

## 2022-05-25 DIAGNOSIS — K029 Dental caries, unspecified: Secondary | ICD-10-CM | POA: Diagnosis not present

## 2022-05-25 LAB — CBC
HCT: 40 % (ref 39.0–52.0)
Hemoglobin: 13.6 g/dL (ref 13.0–17.0)
MCH: 31.9 pg (ref 26.0–34.0)
MCHC: 34 g/dL (ref 30.0–36.0)
MCV: 93.7 fL (ref 80.0–100.0)
Platelets: 218 10*3/uL (ref 150–400)
RBC: 4.27 MIL/uL (ref 4.22–5.81)
RDW: 14.5 % (ref 11.5–15.5)
WBC: 6 10*3/uL (ref 4.0–10.5)
nRBC: 0 % (ref 0.0–0.2)

## 2022-05-25 LAB — BASIC METABOLIC PANEL
Anion gap: 4 — ABNORMAL LOW (ref 5–15)
BUN: 12 mg/dL (ref 6–20)
CO2: 25 mmol/L (ref 22–32)
Calcium: 8.9 mg/dL (ref 8.9–10.3)
Chloride: 113 mmol/L — ABNORMAL HIGH (ref 98–111)
Creatinine, Ser: 1.14 mg/dL (ref 0.61–1.24)
GFR, Estimated: >60 mL/min (ref >60)
Glucose, Bld: 79 mg/dL (ref 70–99)
Potassium: 4.1 mmol/L (ref 3.5–5.1)
Sodium: 142 mmol/L (ref 135–145)

## 2022-05-25 LAB — TROPONIN I (HIGH SENSITIVITY)
Troponin I (High Sensitivity): 4 ng/L (ref <18)
Troponin I (High Sensitivity): 4 ng/L (ref <18)

## 2022-05-25 MED ORDER — PENICILLIN V POTASSIUM 500 MG PO TABS: 500.0000 mg | ORAL_TABLET | Freq: Three times a day (TID) | ORAL | 0 refills | Status: DC

## 2022-05-25 MED ORDER — ALUM & MAG HYDROXIDE-SIMETH 200-200-20 MG/5ML PO SUSP
30.0000 mL | Freq: Once | ORAL | Status: AC
  Administered 2022-05-25: 30 mL via ORAL
  Filled 2022-05-25: qty 30

## 2022-05-25 MED ORDER — ACETAMINOPHEN 500 MG PO TABS: 500.0000 mg | ORAL_TABLET | Freq: Four times a day (QID) | ORAL | 0 refills | Status: DC | PRN

## 2022-05-25 NOTE — ED Provider Triage Note (Signed)
Emergency Medicine Provider Triage Evaluation Note  Christopher Sharp , a 35 y.o. male  was evaluated in triage.  Pt complains of left lower sided dental pain since Saturday.  Patient reports since this time has been taking BC powders in excess.  The patient states he is also taking Tylenol.  Patient reports that this morning he had 4 episodes of palpitation described as "my heart trying to jump out of my chest".  The patient states that he also had 1 episode here in the department.  Patient denies any actual chest pain, shortness of breath, nausea or vomiting.  The patient is endorsing slight lightheadedness.  Patient denies any weakness or syncope.  Review of Systems  Positive:  Negative:   Physical Exam  Ht 6\' 1"  (1.854 m)   Wt 63 kg   BMI 18.34 kg/m  Gen:   Awake, no distress   Resp:  Normal effort  MSK:   Moves extremities without difficulty  Other:  Lung sounds clear.  Broken off first molar on left side.  Medical Decision Making  Medically screening exam initiated at 9:38 AM.  Appropriate orders placed.  Christopher Sharp was informed that the remainder of the evaluation will be completed by another provider, this initial triage assessment does not replace that evaluation, and the importance of remaining in the ED until their evaluation is complete.  Work-up initiated   Priscille Kluver, Al Decant 05/25/22 05/27/22

## 2022-05-25 NOTE — Discharge Instructions (Addendum)
Your EKG, chest xray and labs are reassuring during today's visit.  I suspect his symptoms may be due to irritation from excessive use of BC powder.  For your dental pain, take Tylenol and take antibiotic as prescribed.  Call and follow-up closely with a dentist for further managements of your condition.  Avoid taking excessive amount of anti-inflammatory medication such as BC powders as it can upset your stomach.

## 2022-05-25 NOTE — ED Triage Notes (Signed)
Pt reports left lower dental pain for the past 4 days. Pt also reports palpitations that started this morning, denies chest pain. States he has been taking BC powders for the pain.

## 2022-05-25 NOTE — ED Provider Notes (Signed)
[facility] EMERGENCY DEPARTMENT Provider Note   CSN: [id number] Arrival date & time: 05/25/22  0920     History  Chief Complaint  Patient presents with   Palpitations   Dental Pain    Christopher Sharp is a 36 y.o. male.  The history is provided by the patient. No language interpreter was used.  Palpitations Dental Pain   36 year old male with significant history anxiety, depression, seizures, presenting complaining of dental pain and heart palpitation.  Patient report for the past week he has had recurrent dental pain.  Pain is involving his left lower molar described as a throbbing aching sensation worse with chewing.  He admits to taking BC powders every 4 hours for the past 3 to 4 days.  He believes it has caused him to have some chest discomfort.  He reported having sensation of heart palpitation and irritation to his left chest that has been persistent since this morning which concerns him.  There are no associated lightheadedness dizziness shortness of breath diaphoresis or nausea.  He admits to tobacco use and smokes cigar on a regular basis.  He does not endorse any alcohol use.  He did try to reach out to a dentist but his next dental appointment is in August.  He does not have any significant cardiac history or any family history of premature cardiac death.  He does not have any history of thyroid disease  Home Medications Prior to Admission medications   Not on File      Allergies    Pollen extract    Review of Systems   Review of Systems  Cardiovascular:  Positive for palpitations.  All other systems reviewed and are negative.   Physical Exam Updated Vital Signs BP 133/79 (BP Location: Right Arm)   Pulse 64   Temp 98 F (36.7 C) (Oral)   Resp 17   Ht 6\' 1"  (1.854 m)   Wt 63 kg   SpO2 100%   BMI 18.34 kg/m  Physical Exam Vitals and nursing note reviewed.  Constitutional:      General: He is not in acute distress.    Appearance: He is  well-developed.  HENT:     Head: Atraumatic.     Mouth/Throat:     Comments: Mouth: Poor dental decay noted to tooth #17 and 18 with tenderness to palpation.  No significant gingival erythema or edema or facial swelling noted. Eyes:     Conjunctiva/sclera: Conjunctivae normal.  Neck:     Comments: No goiter, no thyromegaly Cardiovascular:     Rate and Rhythm: Normal rate and regular rhythm.  Pulmonary:     Effort: Pulmonary effort is normal.     Breath sounds: Normal breath sounds.  Abdominal:     Palpations: Abdomen is soft.  Musculoskeletal:     Cervical back: Normal range of motion and neck supple.  Skin:    Findings: No rash.  Neurological:     Mental Status: He is alert.     ED Results / Procedures / Treatments   Labs (all labs ordered are listed, but only abnormal results are displayed) Labs Reviewed  BASIC METABOLIC PANEL - Abnormal; Notable for the following components:      Result Value   Chloride 113 (*)    Anion gap 4 (*)    All other components within normal limits  CBC  TROPONIN I (HIGH SENSITIVITY)  TROPONIN I (HIGH SENSITIVITY)    EKG EKG Interpretation  Date/Time:  Wednesday May 25 2022 09:27:13 EDT Ventricular Rate:  64 PR Interval:  132 QRS Duration: 88 QT Interval:  356 QTC Calculation: 367 R Axis:   87 Text Interpretation: Normal sinus rhythm Early repolarization Normal ECG When compared with ECG of 03-Nov-2014 16:19, PREVIOUS ECG IS PRESENT since last tracing no significant change Confirmed by Eber Hong ([id number]) on 05/25/2022 12:01:08 PM  Radiology DG Chest 1 View  Result Date: 05/25/2022 CLINICAL DATA:  Palpitations.  Chest pain. EXAM: CHEST  1 VIEW COMPARISON:  None Available. FINDINGS: The heart size and mediastinal contours are within normal limits. Both lungs are clear. No visible pleural effusions or pneumothorax. No acute osseous abnormality. IMPRESSION: No evidence of acute cardiopulmonary disease. Electronically Signed   By:  Feliberto Harts M.D.   On: 05/25/2022 09:59    Procedures Procedures    Medications Ordered in ED Medications  alum & mag hydroxide-simeth (MAALOX/MYLANTA) 200-200-20 MG/5ML suspension 30 mL (30 mLs Oral Given 05/25/22 1159)    ED Course/ Medical Decision Making/ A&P                           Medical Decision Making  BP 133/79 (BP Location: Right Arm)   Pulse 64   Temp 98 F (36.7 C) (Oral)   Resp 17   Ht 6\' 1"  (1.854 m)   Wt 63 kg   SpO2 100%   BMI 18.34 kg/m   11:46 AM This is a 36 year old male with significant history of depression, seizure, presenting today with complaints of heart palpitation and left-sided chest discomfort.  He reports some sensation of heart beating abnormally and irritation to his left lower chest that started this morning.  Pain is not associate with any lightheadedness dizziness nausea diaphoresis and he does not have any significant cardiac history.  He does admits to having dental pain involving his left lower molar for the past week and has been consuming quite a bit of BC powders to help alleviate his pain.  States he takes [facility] powder every 4 hours for the past 3 days and he felt that it may have caused the symptoms.  Denies any alcohol use.  He does not have any cardiac history and does not endorse any history of thyroid disease.  On exam, patient has significant dental decay noted to tooth #17 and 18 with some tenderness to palpation.  He does not have any facial swelling and no obvious abscess amenable for drainage.  He would likely benefit from antibiotic to treat his dental infection.  He does admits to taking quite a bit of NSAIDs for the past few days and now endorsing some chest discomfort and heart palpitation.  Labs, EKG, and imaging independently viewed interpreted by me and agree with radiologist interpretation.  EKG without any concerning ischemic changes or arrhythmia.  Chest x-ray without any active cardiopulmonary disease.  His troponin is  normal.  Electrolyte panels are reassuring, normal WBC, normal H&H.  I felt patient's symptoms likely due to possible gastritis secondary to NSAID use and less likely to be ACS.  Patient hear score is 0, low risk of Mace.  No evidence of arrhythmia on EKG.  This patient presents to the ED for concern of heart palpitation/chest pain, this involves an extensive number of treatment options, and is a complaint that carries with it a high risk of complications and morbidity.  The differential diagnosis includes gastritis, gerd, pud, acs, anxiety  Co morbidities that complicate the patient evaluation  none Additional history obtained:  Additional history obtained from patient External records from outside source obtained and reviewed including EMR including prior labs and imaging  Lab Tests:  I Ordered, and personally interpreted labs.  The pertinent results include:  as above  Imaging Studies ordered:  I ordered imaging studies including cxr I independently visualized and interpreted imaging which showed no acute finding I agree with the radiologist interpretation  Cardiac Monitoring:  The patient was maintained on a cardiac monitor.  I personally viewed and interpreted the cardiac monitored which showed an underlying rhythm of: NSR  Medicines ordered and prescription drug management:  I ordered medication including gi cocktail  for heart burn Reevaluation of the patient after these medicines showed that the patient improved I have reviewed the patients home medicines and have made adjustments as needed  Test Considered: as above  Critical Interventions: gi coctail   Problem List / ED Course: heart palpitation  Dental infection  Reevaluation:  After the interventions noted above, I reevaluated the patient and found that they have :improved  Social Determinants of Health: none  Dispostion:  After consideration of the diagnostic results and the patients response to  treatment, I feel that the patent would benefit from outpt f/u.         Final Clinical Impression(s) / ED Diagnoses Final diagnoses:  Palpitations  Pain due to dental caries    Rx / DC Orders ED Discharge Orders          Ordered    penicillin v potassium (VEETID) 500 MG tablet  3 times daily        05/25/22 1205    acetaminophen (TYLENOL) 500 MG tablet  Every 6 hours PRN        05/25/22 1205              Fayrene Helper, PA-C 05/25/22 1207    Eber Hong, MD 05/26/22 [phone]

## 2022-06-06 ENCOUNTER — Other Ambulatory Visit: Payer: Self-pay | Admitting: Podiatry

## 2022-06-06 ENCOUNTER — Encounter: Payer: Self-pay | Admitting: Podiatry

## 2022-06-06 DIAGNOSIS — M25774 Osteophyte, right foot: Secondary | ICD-10-CM | POA: Diagnosis not present

## 2022-06-06 MED ORDER — IBUPROFEN 800 MG PO TABS: 800.0000 mg | ORAL_TABLET | Freq: Four times a day (QID) | ORAL | 1 refills | Status: DC | PRN

## 2022-06-06 MED ORDER — OXYCODONE-ACETAMINOPHEN 5-325 MG PO TABS: 1.0000 | ORAL_TABLET | ORAL | 0 refills | Status: DC | PRN

## 2022-06-15 ENCOUNTER — Ambulatory Visit (INDEPENDENT_AMBULATORY_CARE_PROVIDER_SITE_OTHER): Payer: BC Managed Care – PPO | Admitting: Podiatry

## 2022-06-15 ENCOUNTER — Ambulatory Visit (INDEPENDENT_AMBULATORY_CARE_PROVIDER_SITE_OTHER): Payer: BC Managed Care – PPO

## 2022-06-15 DIAGNOSIS — Z9889 Other specified postprocedural states: Secondary | ICD-10-CM

## 2022-06-15 DIAGNOSIS — M898X9 Other specified disorders of bone, unspecified site: Secondary | ICD-10-CM | POA: Diagnosis not present
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
## 2022-06-29 ENCOUNTER — Ambulatory Visit (INDEPENDENT_AMBULATORY_CARE_PROVIDER_SITE_OTHER): Payer: BC Managed Care – PPO | Admitting: Podiatry

## 2022-06-29 ENCOUNTER — Encounter: Payer: Self-pay | Admitting: Podiatry

## 2022-06-29 DIAGNOSIS — M898X9 Other specified disorders of bone, unspecified site: Secondary | ICD-10-CM

## 2022-06-29 DIAGNOSIS — Z9889 Other specified postprocedural states: Secondary | ICD-10-CM
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
## 2022-07-05 ENCOUNTER — Encounter: Payer: Self-pay | Admitting: Podiatry

## 2022-07-08 ENCOUNTER — Other Ambulatory Visit: Payer: Self-pay

## 2022-07-12 ENCOUNTER — Other Ambulatory Visit: Payer: Self-pay | Admitting: Podiatry

## 2022-07-12 MED ORDER — OXYCODONE-ACETAMINOPHEN 5-325 MG PO TABS: 1.0000 | ORAL_TABLET | ORAL | 0 refills | Status: DC | PRN

## 2022-07-22 ENCOUNTER — Other Ambulatory Visit: Payer: Self-pay | Admitting: Podiatry

## 2022-07-22 ENCOUNTER — Ambulatory Visit (INDEPENDENT_AMBULATORY_CARE_PROVIDER_SITE_OTHER): Payer: BC Managed Care – PPO | Admitting: Podiatry

## 2022-07-22 ENCOUNTER — Encounter: Payer: Self-pay | Admitting: Podiatry

## 2022-07-22 DIAGNOSIS — B351 Tinea unguium: Secondary | ICD-10-CM

## 2022-07-22 DIAGNOSIS — Q828 Other specified congenital malformations of skin: Secondary | ICD-10-CM | POA: Diagnosis not present

## 2022-07-22 DIAGNOSIS — M79674 Pain in right toe(s): Secondary | ICD-10-CM

## 2022-07-22 DIAGNOSIS — M79675 Pain in left toe(s): Secondary | ICD-10-CM

## 2022-07-22 DIAGNOSIS — M79672 Pain in left foot: Secondary | ICD-10-CM

## 2022-07-22 DIAGNOSIS — M79671 Pain in right foot: Secondary | ICD-10-CM

## 2022-07-22 MED ORDER — OXYCODONE-ACETAMINOPHEN 5-325 MG PO TABS: 1.0000 | ORAL_TABLET | ORAL | 0 refills | Status: DC | PRN

## 2022-07-29 NOTE — Progress Notes (Signed)
  Subjective:  Patient ID: Christopher Sharp, male    DOB: July 30, 1986,  MRN: [id number]  Christopher Sharp presents to clinic today for painful porokeratotic lesion(s) b/l lower extremities and painful mycotic toenails that limit ambulation. Painful toenails interfere with ambulation. Aggravating factors include wearing enclosed shoe gear. Pain is relieved with periodic professional debridement. Painful porokeratotic lesions are aggravated when weightbearing with and without shoegear. Pain is relieved with periodic professional debridement.  Patient has right foot surgery and is under the care of Dr. Posey Pronto. He states he is out of pain medication and is requesting a refill on today's visit..  New problem(s): None.   Patient has no PCP.  Allergies  Allergen Reactions   Pollen Extract Itching and Swelling   Review of Systems: Negative except as noted in the HPI.  Objective: No changes noted in today's physical examination.  Christopher Sharp is a pleasant 36 y.o. male thin build in NAD. AAO x 3. Vascular Examination:  Capillary refill time to digits immediate b/l. Palpable pedal pulses b/l LE. Pedal hair present. Lower extremity skin temperature gradient within normal limits. No pain with calf compression b/l. No edema noted b/l lower extremities.  Dermatological Examination: Pedal skin with normal turgor, texture and tone bilaterally. No open wounds bilaterally. No interdigital macerations bilaterally. Toenails 1-5 b/l well maintained with adequate length. No erythema, no edema, no drainage, no fluctuance. Hyperkeratotic lesion(s) submet head 1 left foot, sub 5th met base b/l, medial IPJ b/l great toes. Porokeratotic lesions submet head 1 right foot, submet head 3 left foot and submet head 2 right foot.  No erythema, no edema, no drainage, no fluctuance.   Well healed surgical scars b/l LE.  Musculoskeletal: Normal muscle strength 5/5 to all lower extremity muscle groups bilaterally. No pain crepitus or  joint limitation noted with ROM b/l. Plantarflexed metatarsal(s) left plantar forefoot and right plantar forefoot. Hammertoes noted to the 2-5 bilaterally.  Neurological: Protective sensation intact 5/5 intact bilaterally with 10g monofilament b/l. Vibratory sensation intact b/l. Assessment/Plan: 1. Pain due to onychomycosis of toenails of both feet   2. Porokeratosis   3. Pain in both feet     No orders of the defined types were placed in this encounter. No orders of the defined types were placed in this encounter.   -Patient was evaluated and treated. All patient's and/or POA's questions/concerns answered on today's visit. -Patient to continue soft, supportive shoe gear daily. -Mycotic toenails 1-5 bilaterally were debrided in length and girth with sterile nail nippers and dremel without incident. -Callus(es) submet head 1 left foot pared utilizing sterile scalpel blade without complication or incident. Total number debrided =1. -Porokeratotic lesion(s) submet head 2 right foot, submet head 3 left foot, and sub 5th met base b/l lower extremities pared and enucleated with sterile currette without incident. Total number of lesions debrided=4. -Informed Dr. Posey Pronto patient is requesting refill of his onychomyso. -Patient/POA to call should there be question/concern in the interim.   Return in about 3 months (around 10/21/2022).  Marzetta Board, DPM

## 2022-08-17 ENCOUNTER — Other Ambulatory Visit: Payer: Self-pay

## 2022-08-17 ENCOUNTER — Other Ambulatory Visit: Payer: Self-pay | Admitting: Podiatry

## 2022-08-17 DIAGNOSIS — Z9889 Other specified postprocedural states: Secondary | ICD-10-CM

## 2022-08-25 ENCOUNTER — Encounter (HOSPITAL_COMMUNITY): Payer: Self-pay

## 2022-08-25 ENCOUNTER — Ambulatory Visit (HOSPITAL_COMMUNITY)
Admission: EM | Admit: 2022-08-25 | Discharge: 2022-08-25 | Disposition: A | Discharge status: Home / Self Care | Payer: BC Managed Care – PPO | Attending: Family Medicine | Admitting: Family Medicine

## 2022-08-25 DIAGNOSIS — L089 Local infection of the skin and subcutaneous tissue, unspecified: Secondary | ICD-10-CM

## 2022-08-25 DIAGNOSIS — B9689 Other specified bacterial agents as the cause of diseases classified elsewhere: Secondary | ICD-10-CM

## 2022-08-25 MED ORDER — DOXYCYCLINE HYCLATE 100 MG PO CAPS: 100.0000 mg | ORAL_CAPSULE | Freq: Two times a day (BID) | ORAL | 0 refills | Status: DC

## 2022-08-25 NOTE — ED Triage Notes (Signed)
Pt c/o insect bite to rt wrist a week ago. States now swollen and draining.
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
## 2022-11-08 ENCOUNTER — Ambulatory Visit (INDEPENDENT_AMBULATORY_CARE_PROVIDER_SITE_OTHER): Payer: BC Managed Care – PPO | Admitting: Podiatry

## 2022-11-08 DIAGNOSIS — Z91199 Patient's noncompliance with other medical treatment and regimen due to unspecified reason: Secondary | ICD-10-CM

## 2022-11-08 NOTE — Progress Notes (Signed)
1. No-show for appointment     

## 2022-12-16 ENCOUNTER — Encounter (HOSPITAL_COMMUNITY): Payer: Self-pay | Admitting: *Deleted

## 2022-12-16 ENCOUNTER — Ambulatory Visit (HOSPITAL_COMMUNITY)
Admission: EM | Admit: 2022-12-16 | Discharge: 2022-12-16 | Disposition: A | Discharge status: Home / Self Care | Payer: BC Managed Care – PPO | Attending: Emergency Medicine | Admitting: Emergency Medicine

## 2022-12-16 DIAGNOSIS — R058 Other specified cough: Secondary | ICD-10-CM | POA: Insufficient documentation

## 2022-12-16 DIAGNOSIS — J069 Acute upper respiratory infection, unspecified: Secondary | ICD-10-CM

## 2022-12-16 DIAGNOSIS — Z1152 Encounter for screening for COVID-19: Secondary | ICD-10-CM | POA: Insufficient documentation

## 2022-12-16 DIAGNOSIS — E872 Acidosis, unspecified: Secondary | ICD-10-CM | POA: Insufficient documentation

## 2022-12-16 MED ORDER — BENZONATATE 200 MG PO CAPS: 200.0000 mg | ORAL_CAPSULE | Freq: Three times a day (TID) | ORAL | 0 refills | Status: AC | PRN

## 2022-12-16 MED ORDER — ACETAMINOPHEN 325 MG PO TABS
ORAL_TABLET | ORAL | Status: AC
  Filled 2022-12-16: qty 2

## 2022-12-16 MED ORDER — PROMETHAZINE-DM 6.25-15 MG/5ML PO SYRP: 5.0000 mL | ORAL_SOLUTION | Freq: Three times a day (TID) | ORAL | 0 refills | Status: AC | PRN

## 2022-12-16 MED ORDER — ACETAMINOPHEN 325 MG PO TABS
650.0000 mg | ORAL_TABLET | Freq: Once | ORAL | Status: AC
  Administered 2022-12-16: 650 mg via ORAL

## 2022-12-16 MED ORDER — ALBUTEROL SULFATE HFA 108 (90 BASE) MCG/ACT IN AERS: 2.0000 | INHALATION_SPRAY | RESPIRATORY_TRACT | 0 refills | Status: AC | PRN

## 2022-12-16 NOTE — ED Triage Notes (Signed)
Pt states he has had chills, body aches, cough, congestion, headache x 3 days. He has been taking Nyquil without relief.
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
## 2022-12-16 NOTE — Discharge Instructions (Addendum)
Your symptoms appear consistent with a viral upper respiratory infection.  We have swabbed you for COVID today and will call if the results are positive.  Please reference your MyChart as we do not call if results are negative.  You are given Tylenol in clinic today, you can alternate between Tylenol and ibuprofen every 6-8 hours for fever, body aches, pains.  Please use the albuterol inhaler as needed for shortness of breath, you can do Tessalon Perles as needed for cough, and promethazine DM as needed for cough at night.  Do not drink or drive on this medication as it may make you drowsy.  If your symptoms have not improved over the next week you can revisit the clinic or get established with a primary care for further evaluation.  Please seek immediate care if you develop chest pain, shortness of breath, fever that does not resolve with medication.

## 2022-12-17 LAB — SARS CORONAVIRUS 2 (TAT 6-24 HRS): SARS Coronavirus 2: NEGATIVE

## 2022-12-22 ENCOUNTER — Encounter (HOSPITAL_COMMUNITY): Payer: Self-pay

## 2023-04-09 ENCOUNTER — Other Ambulatory Visit: Payer: Self-pay

## 2023-04-09 ENCOUNTER — Encounter (HOSPITAL_COMMUNITY): Payer: Self-pay

## 2023-04-09 ENCOUNTER — Emergency Department (HOSPITAL_COMMUNITY)
Admission: EM | Admit: 2023-04-09 | Discharge: 2023-04-09 | Discharge status: Against Medical Advice | Payer: BC Managed Care – PPO | Attending: Emergency Medicine | Admitting: Emergency Medicine

## 2023-04-09 ENCOUNTER — Emergency Department (HOSPITAL_COMMUNITY): Payer: BC Managed Care – PPO

## 2023-04-09 DIAGNOSIS — W130XXA Fall from, out of or through balcony, initial encounter: Secondary | ICD-10-CM | POA: Diagnosis not present

## 2023-04-09 DIAGNOSIS — Z5321 Procedure and treatment not carried out due to patient leaving prior to being seen by health care provider: Secondary | ICD-10-CM | POA: Diagnosis not present

## 2023-04-09 DIAGNOSIS — S99912A Unspecified injury of left ankle, initial encounter: Secondary | ICD-10-CM | POA: Diagnosis present

## 2023-04-09 MED ORDER — ACETAMINOPHEN 500 MG PO TABS
1000.0000 mg | ORAL_TABLET | Freq: Once | ORAL | Status: AC
  Administered 2023-04-09: 1000 mg via ORAL
  Filled 2023-04-09: qty 2

## 2023-04-09 NOTE — ED Notes (Signed)
Pt called for vitals no answer Pt seen leaving.

## 2023-04-09 NOTE — ED Triage Notes (Signed)
At 2300 last night pt jumped of of a porch and landed on left ankle. Pt unable to put weight on left ankle and says he is unable to move toes. Some swelling noted. No discoloration. Pedal pulse +2.
